# Patient Record
Sex: Female | Born: 1970 | Race: White | Hispanic: No | Marital: Married | Smoking: Never smoker
Health system: Southern US, Community
[De-identification: ages and names within clinical notes are randomized; demographics above are authoritative.]

## PROBLEM LIST (undated history)

## (undated) DIAGNOSIS — Z8742 Personal history of other diseases of the female genital tract: Secondary | ICD-10-CM

## (undated) HISTORY — PX: WISDOM TOOTH EXTRACTION: SHX21

## (undated) HISTORY — PX: INDUCED ABORTION: SHX677

## (undated) HISTORY — DX: Personal history of other diseases of the female genital tract: Z87.42

---

## 2007-04-11 ENCOUNTER — Encounter: Payer: Self-pay | Admitting: Obstetrics and Gynecology

## 2007-06-20 ENCOUNTER — Inpatient Hospital Stay: Payer: Self-pay

## 2007-08-01 HISTORY — PX: COLPOSCOPY: SHX161

## 2008-02-13 ENCOUNTER — Ambulatory Visit: Payer: Self-pay | Admitting: Unknown Physician Specialty

## 2008-03-10 ENCOUNTER — Ambulatory Visit: Payer: Self-pay | Admitting: Gynecologic Oncology

## 2009-10-25 ENCOUNTER — Ambulatory Visit: Payer: Self-pay | Admitting: Unknown Physician Specialty

## 2009-11-02 ENCOUNTER — Ambulatory Visit: Payer: Self-pay | Admitting: Unknown Physician Specialty

## 2012-07-31 HISTORY — PX: HYSTEROSCOPY WITH D & C: SHX1775

## 2013-03-11 ENCOUNTER — Ambulatory Visit: Payer: Self-pay | Admitting: Obstetrics and Gynecology

## 2013-03-11 LAB — CBC
HCT: 37.2 % (ref 35.0–47.0)
HGB: 13.3 g/dL (ref 12.0–16.0)
MCH: 30.6 pg (ref 26.0–34.0)
MCHC: 35.6 g/dL (ref 32.0–36.0)
MCV: 86 fL (ref 80–100)
Platelet: 207 10*3/uL (ref 150–440)
RDW: 12.5 % (ref 11.5–14.5)

## 2013-03-11 LAB — BASIC METABOLIC PANEL
BUN: 14 mg/dL (ref 7–18)
Chloride: 103 mmol/L (ref 98–107)
Co2: 31 mmol/L (ref 21–32)
Osmolality: 271 (ref 275–301)
Sodium: 136 mmol/L (ref 136–145)

## 2013-03-13 ENCOUNTER — Ambulatory Visit: Payer: Self-pay | Admitting: Obstetrics and Gynecology

## 2014-11-20 NOTE — Op Note (Signed)
PATIENT NAME:  Cassandra Macias, Cassandra Macias MR#:  [id number] DATE OF BIRTH:  08-30-1970  DATE OF PROCEDURE:  03/13/2013  PREOPERATIVE DIAGNOSIS: Menorrhagia and dysmenorrhea.  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  PROCEDURE PERFORMED: 1.  Hysteroscopy.  2.  Dilatation and curettage.   SURGEON: Malachy Mood, MD  ANESTHESIA USED: General.   ESTIMATED BLOOD LOSS: Minimal.   OPERATIVE FLUIDS: 1 liter.   COMPLICATIONS: None.   INTRAOPERATIVE FINDINGS: A small fibrous polyp in the right cornu. 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 NovaSure was aborted secondary to inability to seat the device correctly and unable to get a good seal secondary to the cervical dilation that had been required to pass the hysteroscope. The post-attempted NovaSure hysteroscopy was conducted to ensure that there was no perforations in the actual uterine body with none noted.   SPECIMENS REMOVED: Endometrial curettings.   PATIENT CONDITION FOLLOWING PROCEDURE: Stable.   PROCEDURE IN DETAIL: Risks, benefits and alternatives of the procedure were discussed with the patient prior to proceeding to the operating room. The patient had undergone a prior SIS showing cavity filling defects which is what prompted the procedure in the first place. The patient was taken to the operating room and placed under general endotracheal anesthesia. She was prepped and draped in the usual sterile fashion and positioned in the dorsal lithotomy position. Timeout was performed. Attention was turned to the patient's pelvis. An operative speculum was placed. The anterior lip of the cervix was visualized and grasped with a single-tooth tenaculum. There was a moderate amount of cervical stenosis. The patient had undergone a  prior LEEP. The cervix was sequentially dilated to allow passage of the hysteroscope. Hysteroscopic findings at the beginning of the case were notable for a right cornual fibroid versus polyp. The remainder of the cavity appeared grossly normal. The polyp or fibroid was removed using a combination of sharp curettage and endometrial polyp forceps. Following removal of the polyp, the uterus was sounded to 9 cm. Cervical length was 4 cm. The NovaSure device was placed with cavity width of 4.5 cm. However, the NovaSure device did not pass the cavity assessment.  The cervix had been dilated too much to allow good creation of a good seal. 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 Given the finding of the bicornuate uterus in addition to inability to create an adequate field, NovaSure ablation was aborted. The cervix was visualized to be hemostatic. Sponge, needle and instrument counts were correct x 2. The patient tolerated the procedure well and was taken to the recovery room in stable condition. ____________________________ Stoney Bang. Georgianne Fick, MD ams:sb D: 03/14/2013 09:57:05 ET T: 03/14/2013 10:15:50 ET JOB#: [id number]  cc: Stoney Bang. Georgianne Fick, MD, <Dictator> Dorthula Nettles MD ELECTRONICALLY SIGNED 04/01/2013 22:42

## 2016-03-09 LAB — HM PAP SMEAR: HM Pap smear: NEGATIVE

## 2017-03-06 ENCOUNTER — Encounter: Payer: Self-pay | Admitting: Obstetrics and Gynecology

## 2017-03-12 NOTE — Progress Notes (Signed)
Appointment cancelled ROS

## 2017-03-13 ENCOUNTER — Ambulatory Visit (INDEPENDENT_AMBULATORY_CARE_PROVIDER_SITE_OTHER): Payer: BC Managed Care – PPO | Admitting: Obstetrics and Gynecology

## 2017-03-13 DIAGNOSIS — Z5329 Procedure and treatment not carried out because of patient's decision for other reasons: Secondary | ICD-10-CM

## 2017-03-14 ENCOUNTER — Encounter: Payer: Self-pay | Admitting: Advanced Practice Midwife

## 2017-03-14 ENCOUNTER — Ambulatory Visit (INDEPENDENT_AMBULATORY_CARE_PROVIDER_SITE_OTHER): Payer: BC Managed Care – PPO | Admitting: Advanced Practice Midwife

## 2017-03-14 VITALS — BP 118/68 | HR 58 | Ht 67.0 in | Wt 161.0 lb

## 2017-03-14 DIAGNOSIS — Z3041 Encounter for surveillance of contraceptive pills: Secondary | ICD-10-CM | POA: Diagnosis not present

## 2017-03-14 DIAGNOSIS — Z01419 Encounter for gynecological examination (general) (routine) without abnormal findings: Secondary | ICD-10-CM | POA: Diagnosis not present

## 2017-03-14 DIAGNOSIS — Z124 Encounter for screening for malignant neoplasm of cervix: Secondary | ICD-10-CM

## 2017-03-14 MED ORDER — LO LOESTRIN FE 1 MG-10 MCG / 10 MCG PO TABS: 1.0000 | ORAL_TABLET | Freq: Every day | ORAL | 11 refills | Status: DC

## 2017-03-14 NOTE — Progress Notes (Signed)
Patient ID: Cassandra Macias, female   DOB: 06-25-1971, 46 y.o.   MRN: [id number]     Gynecology Annual Exam  PCP: Patient, No Pcp Per  Chief Complaint:  Chief Complaint  Patient presents with  . Gynecologic Exam    History of Present Illness: Patient is a 46 y.o. [id number] presents for annual exam. The patient has complaint today of recent irregular cycle. She had a heavy period at the end of June. Her periods are usually medium in flow.    LMP: Patient's last menstrual period was 02/26/2017 (exact date). Average Interval: regular, 28 days Duration of flow: 4 days Heavy Menses: no Clots: no Intermenstrual Bleeding: no Postcoital Bleeding: no Dysmenorrhea: no   The patient is sexually active. She currently uses OCP (estrogen/progesterone) for contraception. She denies dyspareunia.  The patient does not perform self breast exams.  There is no notable family history of breast or ovarian cancer in her family. Her mother was diagnosed with breast cancer in her 77's.  The patient wears seatbelts: yes.   The patient has regular exercise: yes.  She admits healthy lifestyle diet and adequate hydration.  The patient denies current symptoms of depression.    Review of Systems: Review of Systems  Constitutional: Negative.   HENT: Negative.   Eyes: Negative.   Respiratory: Negative.   Cardiovascular: Negative.   Gastrointestinal: Negative.   Genitourinary: Negative.   Musculoskeletal: Negative.   Skin: Negative.   Neurological: Negative.   Endo/Heme/Allergies: Negative.   Psychiatric/Behavioral: Negative.     Past Medical History:  Past Medical History:  Diagnosis Date  . History of abnormal cervical Pap smear     Past Surgical History:  Past Surgical History:  Procedure Laterality Date  . CESAREAN SECTION  2004, 2008   [facility]  . COLPOSCOPY  2009  . HYSTEROSCOPY W/D&C  2014   [facility])    Gynecologic History:  Patient's last menstrual period was  02/26/2017 (exact date). Contraception: OCP (estrogen/progesterone) Last Pap: 1 year ago Results were:  no abnormalities  Last mammogram: has not had a mammogram  Obstetric History: [id number]  Family History:  Family History  Problem Relation Age of Onset  . Breast cancer Mother 36  . COPD Father   . Diabetes Father        type 2  . Diabetes type I Sister        1/2 sister  . Diabetes Brother        type 2    Social History:  Social History   Social History  . Marital status: Married    Spouse name: N/A  . Number of children: N/A  . Years of education: N/A   Occupational History  . Not on file.   Social History Main Topics  . Smoking status: Never Smoker  . Smokeless tobacco: Never Used  . Alcohol use Yes  . Drug use: No  . Sexual activity: Yes    Birth control/ protection: Pill   Other Topics Concern  . Not on file   Social History Narrative  . No narrative on file    Allergies:  No Known Allergies  Medications: Prior to Admission medications   Medication Sig Start Date End Date Taking? Authorizing Provider  LO LOESTRIN FE 1 MG-10 MCG / 10 MCG tablet Take 1 tablet by mouth daily. 03/14/17  Yes Rod Can, CNM    Physical Exam Vitals: Blood pressure 118/68, pulse (!) 58, height 5\' 7"  (1.702 m), weight 161 lb (73  kg), last menstrual period 02/26/2017.  General: NAD HEENT: normocephalic, anicteric Thyroid: no enlargement, no palpable nodules Pulmonary: No increased work of breathing, CTAB Cardiovascular: RRR, distal pulses 2+ Breast: Breast symmetrical, no tenderness, no palpable nodules or masses, no skin or nipple retraction present, no nipple discharge.  No axillary or supraclavicular lymphadenopathy. Abdomen: NABS, soft, non-tender, non-distended.  Umbilicus without lesions.  No hepatomegaly, splenomegaly or masses palpable. No evidence of hernia  Genitourinary:  External: Normal external female genitalia.  Normal urethral meatus, normal    Bartholin's and Skene's glands.    Vagina: Normal vaginal mucosa, no evidence of prolapse.    Cervix: Grossly normal in appearance, no bleeding, no CMT  Uterus: Non-enlarged, mobile, normal contour.    Adnexa: ovaries non-enlarged, no adnexal masses  Rectal: deferred  Lymphatic: no evidence of inguinal lymphadenopathy Extremities: no edema, erythema, or tenderness Neurologic: Grossly intact Psychiatric: mood appropriate, affect full   Assessment: 46 y.o. [id number] routine annual exam  Plan: Problem List Items Addressed This Visit    None    Visit Diagnoses    Well woman exam with routine gynecological exam    -  Primary   Relevant Orders   IGP, Aptima HPV   Encounter for surveillance of contraceptive pills       Relevant Medications   LO LOESTRIN FE 1 MG-10 MCG / 10 MCG tablet   Cervical cancer screening       Relevant Orders   IGP, Aptima HPV      1) Mammogram - recommend yearly screening mammogram.  Mammogram recommended self scheduling for screening mammogram   2) STI screening was offered and declined  3) ASCCP guidelines and rational discussed.  Patient opts for yearly screening interval  4) Contraception - patient plans to continue with current OCP  5) Colonoscopy -- Screening recommended starting at age 37 for average risk individuals, age 64 for individuals deemed at increased risk (including African Americans) and recommended to continue until age 50.  For patient age 5-85 individualized approach is recommended.  Gold standard screening is via colonoscopy, Cologuard screening is an acceptable alternative for patient unwilling or unable to undergo colonoscopy.  "Colorectal cancer screening for average?risk adults: 2018 guideline update from the [place]: A Cancer Journal for Clinicians: Dec 27, 2016   6) Routine healthcare maintenance including cholesterol, diabetes screening discussed Declines   7) Continue healthy lifestyle diet and  exercise  8) Return to clinic in 1 year for annual exam   Rod Can, CNM

## 2017-03-16 LAB — IGP, APTIMA HPV
HPV APTIMA: NEGATIVE
PAP Smear Comment: 0

## 2017-11-13 ENCOUNTER — Other Ambulatory Visit: Payer: Self-pay | Admitting: Advanced Practice Midwife

## 2017-11-13 DIAGNOSIS — Z1231 Encounter for screening mammogram for malignant neoplasm of breast: Secondary | ICD-10-CM

## 2017-11-14 ENCOUNTER — Ambulatory Visit
Admission: RE | Admit: 2017-11-14 | Discharge: 2017-11-14 | Disposition: A | Discharge status: Home / Self Care | Payer: BC Managed Care – PPO | Source: Ambulatory Visit | Attending: Advanced Practice Midwife | Admitting: Advanced Practice Midwife

## 2017-11-14 DIAGNOSIS — Z1231 Encounter for screening mammogram for malignant neoplasm of breast: Secondary | ICD-10-CM

## 2017-11-22 ENCOUNTER — Other Ambulatory Visit: Payer: Self-pay | Admitting: *Deleted

## 2017-11-22 ENCOUNTER — Inpatient Hospital Stay
Admission: RE | Admit: 2017-11-22 | Discharge: 2017-11-22 | Disposition: A | Discharge status: Home / Self Care | Payer: Self-pay | Source: Ambulatory Visit | Attending: *Deleted | Admitting: *Deleted

## 2017-11-22 DIAGNOSIS — Z9289 Personal history of other medical treatment: Secondary | ICD-10-CM

## 2018-03-03 ENCOUNTER — Other Ambulatory Visit: Payer: Self-pay

## 2018-03-03 ENCOUNTER — Encounter: Payer: Self-pay | Admitting: Gynecology

## 2018-03-03 ENCOUNTER — Ambulatory Visit
Admission: EM | Admit: 2018-03-03 | Discharge: 2018-03-03 | Disposition: A | Discharge status: Home / Self Care | Payer: BC Managed Care – PPO | Attending: Family Medicine | Admitting: Family Medicine

## 2018-03-03 DIAGNOSIS — R21 Rash and other nonspecific skin eruption: Secondary | ICD-10-CM

## 2018-03-03 DIAGNOSIS — L255 Unspecified contact dermatitis due to plants, except food: Secondary | ICD-10-CM | POA: Diagnosis not present

## 2018-03-03 MED ORDER — HYDROXYZINE HCL 25 MG PO TABS: 25.0000 mg | ORAL_TABLET | Freq: Three times a day (TID) | ORAL | 0 refills | Status: DC | PRN

## 2018-03-03 MED ORDER — PREDNISONE 10 MG PO TABS: ORAL_TABLET | ORAL | 0 refills | Status: DC

## 2018-03-03 NOTE — ED Triage Notes (Signed)
Patient present with poison oak x 1 week.

## 2018-03-03 NOTE — ED Provider Notes (Signed)
[facility]    CSN: [id number] Arrival date & time: 03/03/18  0809  History   Chief Complaint Chief Complaint  Patient presents with  . Rash   HPI  47 year old female presents with rash.  Patient reports that she was doing yard work on Saturday (last week).  She states that she feels like she was exposed to poison ivy.  She developed rash on Wednesday or Thursday.  Rash has progressed.  It is itchy, vesicular.  She has used over-the-counter medications without resolution.  No known exacerbating factors.  Rash is severe.  No other associated symptoms.  No other complaints.  Past Medical History:  Diagnosis Date  . History of abnormal cervical Pap smear    Past Surgical History:  Procedure Laterality Date  . CESAREAN SECTION  2004, 2008   [facility]  . COLPOSCOPY  2009  . HYSTEROSCOPY W/D&C  2014   [facility] ([facility])    OB History    Gravida  3   Para  2   Term  2   Preterm      AB  1   Living  2     SAB  0   TAB  1   Ectopic      Multiple      Live Births  2          Home Medications    Prior to Admission medications   Medication Sig Start Date End Date Taking? Authorizing Provider  LO LOESTRIN FE 1 MG-10 MCG / 10 MCG tablet Take 1 tablet by mouth daily. 03/14/17  Yes Rod Can, CNM  hydrOXYzine (ATARAX/VISTARIL) 25 MG tablet Take 1 tablet (25 mg total) by mouth every 8 (eight) hours as needed. 03/03/18   Coral Spikes, DO  predniSONE (DELTASONE) 10 MG tablet 50 mg daily x 3 days, then 40 mg daily x 3 days, then 30 mg daily x 3 days, then 20 mg daily x 3 days, then 10 mg daily x 3 days. 03/03/18   Coral Spikes, DO    Family History Family History  Problem Relation Age of Onset  . Breast cancer Mother 10  . COPD Father   . Diabetes Father        type 2  . Diabetes type I Sister        1/2 sister  . Diabetes Brother        type 2    Social History Social History   Tobacco Use  . Smoking status: Never Smoker  . Smokeless  tobacco: Never Used  Substance Use Topics  . Alcohol use: Yes  . Drug use: No     Allergies   Patient has no known allergies.   Review of Systems Review of Systems  Constitutional: Negative.   Skin: Positive for rash.   Physical Exam Triage Vital Signs ED Triage Vitals  Enc Vitals Group     BP 03/03/18 0829 129/70     Pulse Rate 03/03/18 0829 60     Resp 03/03/18 0829 16     Temp 03/03/18 0829 98.5 F (36.9 C)     Temp Source 03/03/18 0829 Oral     SpO2 03/03/18 0829 100 %     Weight 03/03/18 0831 154 lb (69.9 kg)     Height 03/03/18 0831 5\' 7"  (1.702 m)     Head Circumference --      Peak Flow --      Pain Score 03/03/18 0840 0  Pain Loc --      Pain Edu? --      Excl. in [place]? --    Updated Vital Signs BP 129/70 (BP Location: Left Arm)   Pulse 60   Temp 98.5 F (36.9 C) (Oral)   Resp 16   Ht 5\' 7"  (1.702 m)   Wt 154 lb (69.9 kg)   LMP 02/28/2018   SpO2 100%   BMI 24.12 kg/m   Visual Acuity Right Eye Distance:   Left Eye Distance:   Bilateral Distance:    Right Eye Near:   Left Eye Near:    Bilateral Near:     Physical Exam  Constitutional: She is oriented to person, place, and time. She appears well-developed. No distress.  HENT:  Head: Normocephalic and atraumatic.  Pulmonary/Chest: Effort normal. No respiratory distress.  Neurological: She is alert and oriented to person, place, and time.  Skin:  Patient with scattered/diffuse areas of linear vesicular rash.  Some bullae present as well.  Psychiatric: She has a normal mood and affect. Her behavior is normal.  Nursing note and vitals reviewed.    UC Treatments / Results  Labs (all labs ordered are listed, but only abnormal results are displayed) Labs Reviewed - No data to display  EKG None  Radiology No results found.  Procedures Procedures (including critical care time)  Medications Ordered in UC Medications - No data to display  Initial Impression / Assessment and Plan / UC  Course  I have reviewed the triage vital signs and the nursing notes.  Pertinent labs & imaging results that were available during my care of the patient were reviewed by me and considered in my medical decision making (see chart for details).    47 year old female presents with dermatitis secondary to poison oak/ivy/sumac. Treating with prednisone and atarax.  Final Clinical Impressions(s) / UC Diagnoses   Final diagnoses:  Dermatitis due to plants, including poison ivy, sumac, and oak   Discharge Instructions   None    ED Prescriptions    Medication Sig Dispense Auth. Provider   predniSONE (DELTASONE) 10 MG tablet 50 mg daily x 3 days, then 40 mg daily x 3 days, then 30 mg daily x 3 days, then 20 mg daily x 3 days, then 10 mg daily x 3 days. 45 tablet Sameul Tagle G, DO   hydrOXYzine (ATARAX/VISTARIL) 25 MG tablet Take 1 tablet (25 mg total) by mouth every 8 (eight) hours as needed. 30 tablet Coral Spikes, DO     Controlled Substance Prescriptions [place] Controlled Substance Registry consulted? Not Applicable   Coral Spikes, DO 03/03/18 [phone]

## 2018-03-28 ENCOUNTER — Other Ambulatory Visit: Payer: Self-pay | Admitting: Advanced Practice Midwife

## 2018-03-28 DIAGNOSIS — Z3041 Encounter for surveillance of contraceptive pills: Secondary | ICD-10-CM

## 2018-04-25 NOTE — Telephone Encounter (Signed)
Pt has scheduled her AE for 05/23/18. Requesting refill. [phone]

## 2018-04-26 ENCOUNTER — Other Ambulatory Visit: Payer: Self-pay | Admitting: Advanced Practice Midwife

## 2018-04-26 DIAGNOSIS — Z3041 Encounter for surveillance of contraceptive pills: Secondary | ICD-10-CM

## 2018-04-26 MED ORDER — LO LOESTRIN FE 1 MG-10 MCG / 10 MCG PO TABS: 1.0000 | ORAL_TABLET | Freq: Every day | ORAL | 0 refills | Status: DC

## 2018-04-26 NOTE — Telephone Encounter (Signed)
1 month refill sent.

## 2018-04-26 NOTE — Progress Notes (Signed)
1 month refill sent. Annual is scheduled for 10/24.

## 2018-05-19 ENCOUNTER — Other Ambulatory Visit: Payer: Self-pay | Admitting: Advanced Practice Midwife

## 2018-05-19 DIAGNOSIS — Z3041 Encounter for surveillance of contraceptive pills: Secondary | ICD-10-CM

## 2018-05-20 NOTE — Telephone Encounter (Signed)
Please advise 

## 2018-05-23 ENCOUNTER — Ambulatory Visit (INDEPENDENT_AMBULATORY_CARE_PROVIDER_SITE_OTHER): Payer: BC Managed Care – PPO | Admitting: Advanced Practice Midwife

## 2018-05-23 ENCOUNTER — Encounter: Payer: Self-pay | Admitting: Advanced Practice Midwife

## 2018-05-23 VITALS — BP 94/60 | HR 66 | Ht 67.0 in | Wt 153.0 lb

## 2018-05-23 DIAGNOSIS — Z01419 Encounter for gynecological examination (general) (routine) without abnormal findings: Secondary | ICD-10-CM

## 2018-05-23 DIAGNOSIS — Z0000 Encounter for general adult medical examination without abnormal findings: Secondary | ICD-10-CM

## 2018-05-23 DIAGNOSIS — Z1239 Encounter for other screening for malignant neoplasm of breast: Secondary | ICD-10-CM

## 2018-05-23 NOTE — Patient Instructions (Signed)
Preventive Care 18-39 Years, Female Preventive care refers to lifestyle choices and visits with your health care provider that can promote health and wellness. What does preventive care include?  A yearly physical exam. This is also called an annual well check.  Dental exams once or twice a year.  Routine eye exams. Ask your health care provider how often you should have your eyes checked.  Personal lifestyle choices, including: ? Daily care of your teeth and gums. ? Regular physical activity. ? Eating a healthy diet. ? Avoiding tobacco and drug use. ? Limiting alcohol use. ? Practicing safe sex. ? Taking vitamin and mineral supplements as recommended by your health care provider. What happens during an annual well check? The services and screenings done by your health care provider during your annual well check will depend on your age, overall health, lifestyle risk factors, and family history of disease. Counseling Your health care provider may ask you questions about your:  Alcohol use.  Tobacco use.  Drug use.  Emotional well-being.  Home and relationship well-being.  Sexual activity.  Eating habits.  Work and work Statistician.  Method of birth control.  Menstrual cycle.  Pregnancy history.  Screening You may have the following tests or measurements:  Height, weight, and BMI.  Diabetes screening. This is done by checking your blood sugar (glucose) after you have not eaten for a while (fasting).  Blood pressure.  Lipid and cholesterol levels. These may be checked every 5 years starting at age 38.  Skin check.  Hepatitis C blood test.  Hepatitis B blood test.  Sexually transmitted disease (STD) testing.  BRCA-related cancer screening. This may be done if you have a family history of breast, ovarian, tubal, or peritoneal cancers.  Pelvic exam and Pap test. This may be done every 3 years starting at age 38. Starting at age 30, this may be done  every 5 years if you have a Pap test in combination with an HPV test.  Discuss your test results, treatment options, and if necessary, the need for more tests with your health care provider. Vaccines Your health care provider may recommend certain vaccines, such as:  Influenza vaccine. This is recommended every year.  Tetanus, diphtheria, and acellular pertussis (Tdap, Td) vaccine. You may need a Td booster every 10 years.  Varicella vaccine. You may need this if you have not been vaccinated.  HPV vaccine. If you are 39 or younger, you may need three doses over 6 months.  Measles, mumps, and rubella (MMR) vaccine. You may need at least one dose of MMR. You may also need a second dose.  Pneumococcal 13-valent conjugate (PCV13) vaccine. You may need this if you have certain conditions and were not previously vaccinated.  Pneumococcal polysaccharide (PPSV23) vaccine. You may need one or two doses if you smoke cigarettes or if you have certain conditions.  Meningococcal vaccine. One dose is recommended if you are age 68-21 years and a first-year college student living in a residence hall, or if you have one of several medical conditions. You may also need additional booster doses.  Hepatitis A vaccine. You may need this if you have certain conditions or if you travel or work in places where you may be exposed to hepatitis A.  Hepatitis B vaccine. You may need this if you have certain conditions or if you travel or work in places where you may be exposed to hepatitis B.  Haemophilus influenzae type b (Hib) vaccine. You may need this  if you have certain risk factors.  Talk to your health care provider about which screenings and vaccines you need and how often you need them. This information is not intended to replace advice given to you by your health care provider. Make sure you discuss any questions you have with your health care provider. Document Released: 09/12/2001 Document Revised:  04/05/2016 Document Reviewed: 05/18/2015 Elsevier Interactive Patient Education  [employer].

## 2018-05-23 NOTE — Progress Notes (Signed)
Patient ID: Cassandra Macias, female   DOB: 1971-01-30, 47 y.o.   MRN: [id number]    Gynecology Annual Exam  PCP: Patient, No Pcp Per  Chief Complaint:  Chief Complaint  Patient presents with  . Gynecologic Exam    History of Present Illness: Patient is a 47 y.o. [id number] presents for annual exam. The patient has no complaints today.   LMP: Patient's last menstrual period was 04/27/2018. Average Interval: regular, 28 days Duration of flow: 4 days Heavy Menses: no Clots: no Intermenstrual Bleeding: no Postcoital Bleeding: no Dysmenorrhea: no   The patient is sexually active. She currently uses OCP (estrogen/progesterone) for contraception. She denies dyspareunia.  The patient does perform self breast exams.  There is no notable family history of breast or ovarian cancer in her family. Her mother was diagnosed with breast cancer in her 40s.  The patient wears seatbelts: yes.   The patient has regular exercise: She is a runner and also does some strength work outs. She admits healthy lifestyle diet, hydration and sleep.    The patient denies current symptoms of depression.    Review of Systems: Review of Systems  Constitutional: Negative.   HENT: Negative.   Eyes: Negative.   Respiratory: Negative.   Cardiovascular: Negative.   Gastrointestinal: Negative.   Genitourinary: Negative.   Musculoskeletal: Negative.   Skin: Negative.   Neurological: Negative.   Endo/Heme/Allergies: Negative.   Psychiatric/Behavioral: Negative.     Past Medical History:  Past Medical History:  Diagnosis Date  . History of abnormal cervical Pap smear     Past Surgical History:  Past Surgical History:  Procedure Laterality Date  . CESAREAN SECTION  2004, 2008   [facility]  . COLPOSCOPY  2009  . HYSTEROSCOPY W/D&C  2014   [facility])  . INDUCED ABORTION      Gynecologic History:  Patient's last menstrual period was 04/27/2018. Contraception: OCP (estrogen/progesterone) Last  Pap: 1 year ago Results were:  no abnormalities  Last mammogram: 6 months ago Results were: BI-RAD I  Obstetric History: [id number]  Family History:  Family History  Problem Relation Age of Onset  . Breast cancer Mother 38  . COPD Father   . Diabetes Father        type 2  . Hypertension Father   . Diabetes type I Sister        1/2 sister  . Diabetes Brother        type 2    Social History:  Social History   Socioeconomic History  . Marital status: Married    Spouse name: Not on file  . Number of children: 2  . Years of education: 58  . Highest education level: Not on file  Occupational History  . Occupation: TEACHER    Comment: [employer]  Social Needs  . Financial resource strain: Not on file  . Food insecurity:    Worry: Not on file    Inability: Not on file  . Transportation needs:    Medical: Not on file    Non-medical: Not on file  Tobacco Use  . Smoking status: Never Smoker  . Smokeless tobacco: Never Used  Substance and Sexual Activity  . Alcohol use: Yes  . Drug use: No  . Sexual activity: Yes    Birth control/protection: Pill  Lifestyle  . Physical activity:    Days per week: Not on file    Minutes per session: Not on file  . Stress: Not on file  Relationships  . Social connections:    Talks on phone: Not on file    Gets together: Not on file    Attends religious service: Not on file    Active member of club or organization: Not on file    Attends meetings of clubs or organizations: Not on file    Relationship status: Not on file  . Intimate partner violence:    Fear of current or ex partner: Not on file    Emotionally abused: Not on file    Physically abused: Not on file    Forced sexual activity: Not on file  Other Topics Concern  . Not on file  Social History Narrative  . Not on file    Allergies:  No Known Allergies  Medications: Prior to Admission medications   Medication Sig Start Date End Date Taking? Authorizing  Provider  LO LOESTRIN FE 1 MG-10 MCG / 10 MCG tablet Take 1 tablet by mouth daily. 04/26/18  Yes Rod Can, CNM    Physical Exam Vitals: Blood pressure 94/60, pulse 66, height 5\' 7"  (1.702 m), weight 153 lb (69.4 kg), last menstrual period 04/27/2018.  General: NAD HEENT: normocephalic, anicteric Thyroid: no enlargement, no palpable nodules Pulmonary: No increased work of breathing, CTAB Cardiovascular: RRR, distal pulses 2+ Breast: Breast symmetrical, no tenderness, no palpable nodules or masses, no skin or nipple retraction present, no nipple discharge.  No axillary or supraclavicular lymphadenopathy. Abdomen: NABS, soft, non-tender, non-distended.  Umbilicus without lesions.  No hepatomegaly, splenomegaly or masses palpable. No evidence of hernia  Genitourinary: deferred for no concerns/PAP interval/shared decision making Extremities: no edema, erythema, or tenderness Neurologic: Grossly intact Psychiatric: mood appropriate, affect full   Assessment: 47 y.o. [id number] routine annual exam  Plan: Problem List Items Addressed This Visit    None    Visit Diagnoses    Well woman exam without gynecological exam    -  Primary   Breast cancer screening       Relevant Orders   MM DIGITAL SCREENING BILATERAL      1) Mammogram - recommend yearly screening mammogram.  Mammogram ordered for 6 months from now for annual screening  2) STI screening  wasoffered and declined  3) ASCCP guidelines and rational discussed.  Patient opts for every 3 years screening interval  4) Contraception - the patient is currently using  OCP (estrogen/progesterone).  She is happy with her current form of contraception and plans to continue  5) Colonoscopy -- Screening recommended starting at age 11 for average risk individuals, age 33 for individuals deemed at increased risk (including African Americans) and recommended to continue until age 36.  For patient age 34-85 individualized approach is  recommended.  Gold standard screening is via colonoscopy, Cologuard screening is an acceptable alternative for patient unwilling or unable to undergo colonoscopy.  "Colorectal cancer screening for average?risk adults: 2018 guideline update from the American Cancer Society"[place]: A Cancer Journal for Clinicians: Dec 27, 2016   6) Routine healthcare maintenance including cholesterol, diabetes screening discussed managed by PCP  7) Return in 1 year (on 05/24/2019) for annual established gyn.   Rod Can, CNM [facility], [facility] 05/23/2018, 8:47 AM

## 2018-06-08 ENCOUNTER — Other Ambulatory Visit: Payer: Self-pay | Admitting: Advanced Practice Midwife

## 2018-06-08 DIAGNOSIS — Z3041 Encounter for surveillance of contraceptive pills: Secondary | ICD-10-CM

## 2019-05-20 ENCOUNTER — Other Ambulatory Visit: Payer: Self-pay | Admitting: Advanced Practice Midwife

## 2019-05-20 DIAGNOSIS — Z3041 Encounter for surveillance of contraceptive pills: Secondary | ICD-10-CM

## 2019-05-29 ENCOUNTER — Other Ambulatory Visit: Payer: Self-pay

## 2019-05-29 ENCOUNTER — Encounter: Payer: Self-pay | Admitting: Advanced Practice Midwife

## 2019-05-29 ENCOUNTER — Ambulatory Visit (INDEPENDENT_AMBULATORY_CARE_PROVIDER_SITE_OTHER): Payer: BC Managed Care – PPO | Admitting: Advanced Practice Midwife

## 2019-05-29 VITALS — BP 121/70 | HR 61 | Ht 67.0 in | Wt 162.0 lb

## 2019-05-29 DIAGNOSIS — Z23 Encounter for immunization: Secondary | ICD-10-CM | POA: Diagnosis not present

## 2019-05-29 DIAGNOSIS — Z01419 Encounter for gynecological examination (general) (routine) without abnormal findings: Secondary | ICD-10-CM | POA: Diagnosis not present

## 2019-05-29 DIAGNOSIS — Z3041 Encounter for surveillance of contraceptive pills: Secondary | ICD-10-CM

## 2019-05-29 DIAGNOSIS — Z1231 Encounter for screening mammogram for malignant neoplasm of breast: Secondary | ICD-10-CM

## 2019-05-29 DIAGNOSIS — Z0000 Encounter for general adult medical examination without abnormal findings: Secondary | ICD-10-CM

## 2019-05-29 MED ORDER — LO LOESTRIN FE 1 MG-10 MCG / 10 MCG PO TABS: 1.0000 | ORAL_TABLET | Freq: Every day | ORAL | 4 refills | Status: DC

## 2019-05-29 NOTE — Progress Notes (Signed)
Gynecology Annual Exam  PCP: Patient, No Pcp Per  Chief Complaint:  Chief Complaint  Patient presents with  . Gynecologic Exam    History of Present Illness: Patient is a 49 y.o. [id number] presents for annual exam. The patient has no complaints today.   LMP: Patient's last menstrual period was 05/21/2019 (exact date). Average Interval: regular, 28 days Duration of flow: 2 days Heavy Menses: no Clots: no Intermenstrual Bleeding: occasional breakthrough bleeding with heavy exercise Postcoital Bleeding: no Dysmenorrhea: no   The patient is sexually active. She currently uses OCP (estrogen/progesterone) for contraception. She denies dyspareunia.  The patient does perform self breast exams.  There is no notable family history of breast or ovarian cancer in her family. Her mother was diagnosed with breast cancer in her 58s.  The patient wears seatbelts: yes.   The patient has regular exercise: She runs regularly and does video workouts. She admits healthy diet, hydration and sleep.   The patient denies current symptoms of depression.    Review of Systems: Review of Systems  Constitutional: Negative.   HENT: Negative.   Eyes: Negative.   Respiratory: Negative.   Cardiovascular: Negative.   Gastrointestinal: Negative.   Genitourinary: Negative.   Musculoskeletal: Negative.   Skin: Negative.   Neurological: Negative.   Endo/Heme/Allergies: Negative.   Psychiatric/Behavioral: Negative.     Past Medical History:  Past Medical History:  Diagnosis Date  . History of abnormal cervical Pap smear     Past Surgical History:  Past Surgical History:  Procedure Laterality Date  . CESAREAN SECTION  2004, 2008   [facility]  . COLPOSCOPY  2009  . HYSTEROSCOPY W/D&C  2014   [facility])  . INDUCED ABORTION      Gynecologic History:  Patient's last menstrual period was 05/21/2019 (exact date). Contraception: OCP (estrogen/progesterone) Last Pap: 2 years ago Results were:   no abnormalities  Last mammogram: 1 year ago Results were: BI-RAD I  Obstetric [id number]  Family History:  Family History  Problem Relation Age of Onset  . Breast cancer Mother 70  . COPD Father   . Diabetes Father        type 2  . Hypertension Father   . Diabetes type I Sister        1/2 sister  . Diabetes Brother        type 2    Social History:  Social History   Socioeconomic History  . Marital status: Married    Spouse name: Not on file  . Number of children: 2  . Years of education: 33  . Highest education level: Not on file  Occupational History  . Occupation: TEACHER    Comment: [employer]  Social Needs  . Financial resource strain: Not on file  . Food insecurity    Worry: Not on file    Inability: Not on file  . Transportation needs    Medical: Not on file    Non-medical: Not on file  Tobacco Use  . Smoking status: Never Smoker  . Smokeless tobacco: Never Used  Substance and Sexual Activity  . Alcohol use: Yes  . Drug use: No  . Sexual activity: Yes    Birth control/protection: Pill  Lifestyle  . Physical activity    Days per week: Not on file    Minutes per session: Not on file  . Stress: Not on file  Relationships  . Social connections    Talks on phone: Not on file  Gets together: Not on file    Attends religious service: Not on file    Active member of club or organization: Not on file    Attends meetings of clubs or organizations: Not on file    Relationship status: Not on file  . Intimate partner violence    Fear of current or ex partner: Not on file    Emotionally abused: Not on file    Physically abused: Not on file    Forced sexual activity: Not on file  Other Topics Concern  . Not on file  Social History Narrative  . Not on file    Allergies:  No Known Allergies  Medications: Prior to Admission medications   Medication Sig Start Date End Date Taking? Authorizing Provider  LO LOESTRIN FE 1 MG-10 MCG  / 10 MCG tablet Take 1 tablet by mouth daily. 05/29/19  Yes Rod Can, CNM    Physical Exam Vitals: Blood pressure 121/70, pulse 61, height 5\' 7"  (1.702 m), weight 162 lb (73.5 kg), last menstrual period 05/21/2019.  General: NAD HEENT: normocephalic, anicteric Thyroid: no enlargement, no palpable nodules Pulmonary: No increased work of breathing, CTAB Cardiovascular: RRR, distal pulses 2+ Breast: Breast symmetrical, no tenderness, no palpable nodules or masses, no skin or nipple retraction present, no nipple discharge.  No axillary or supraclavicular lymphadenopathy. Abdomen: NABS, soft, non-tender, non-distended.  Umbilicus without lesions.  No hepatomegaly, splenomegaly or masses palpable. No evidence of hernia  Genitourinary:  External: Normal external female genitalia.  Normal urethral meatus, normal Bartholin's and Skene's glands.    Vagina: Normal vaginal mucosa, no evidence of prolapse.    Cervix: Grossly normal in appearance, no bleeding  Uterus: Non-enlarged, mobile, normal contour.  No CMT  Adnexa: ovaries non-enlarged, no adnexal masses  Rectal: deferred  Lymphatic: no evidence of inguinal lymphadenopathy Extremities: no edema, erythema, or tenderness Neurologic: Grossly intact Psychiatric: mood appropriate, affect full  Female chaperone present for pelvic and breast  portions of the physical exam    Assessment: 48 y.o. [id number] routine annual exam  Plan: Problem List Items Addressed This Visit    None    Visit Diagnoses    Well woman exam without gynecological exam    -  Primary   Relevant Orders   MM DIGITAL SCREENING BILATERAL   Flu vaccine need       Relevant Orders   Flu Vaccine QUAD 36+ mos IM (Completed)   Encounter for surveillance of contraceptive pills       Relevant Medications   LO LOESTRIN FE 1 MG-10 MCG / 10 MCG tablet   Encounter for screening mammogram for malignant neoplasm of breast       Relevant Orders   MM DIGITAL SCREENING BILATERAL       1) Mammogram - recommend yearly screening mammogram.  Mammogram Was ordered today   2) STI screening  was offered and declined  3) ASCCP guidelines and rationale discussed.  Patient opts for every 3 years screening interval  4) Contraception - the patient is currently using  OCP (estrogen/progesterone).  She is happy with her current form of contraception and plans to continue  5) Colonoscopy -- Screening recommended starting at age 27 for average risk individuals, age 90 for individuals deemed at increased risk (including African Americans) and recommended to continue until age 84.  For patient age 50-85 individualized approach is recommended.  Gold standard screening is via colonoscopy, Cologuard screening is an acceptable alternative for patient unwilling or unable to undergo colonoscopy.  "  Colorectal cancer screening for average?risk adults: 2018 guideline update from the [place]: A Cancer Journal for Clinicians: Dec 27, 2016   6) Routine healthcare maintenance including cholesterol, diabetes screening discussed Declines  7) Return in about 1 year (around 05/28/2020) for annual established gyn.   Rod Can, [facility] 05/29/2019, 4:03 PM

## 2019-06-05 ENCOUNTER — Other Ambulatory Visit: Payer: Self-pay | Admitting: Advanced Practice Midwife

## 2019-06-05 DIAGNOSIS — Z1231 Encounter for screening mammogram for malignant neoplasm of breast: Secondary | ICD-10-CM

## 2019-06-10 ENCOUNTER — Other Ambulatory Visit: Payer: Self-pay

## 2019-06-10 DIAGNOSIS — Z20822 Contact with and (suspected) exposure to covid-19: Secondary | ICD-10-CM

## 2019-06-12 LAB — NOVEL CORONAVIRUS, NAA: SARS-CoV-2, NAA: DETECTED — AB

## 2019-09-15 ENCOUNTER — Other Ambulatory Visit: Payer: Self-pay

## 2019-09-15 ENCOUNTER — Ambulatory Visit
Admission: RE | Admit: 2019-09-15 | Discharge: 2019-09-15 | Disposition: A | Discharge status: Home / Self Care | Payer: BC Managed Care – PPO | Source: Ambulatory Visit | Attending: Advanced Practice Midwife | Admitting: Advanced Practice Midwife

## 2019-09-15 DIAGNOSIS — Z1231 Encounter for screening mammogram for malignant neoplasm of breast: Secondary | ICD-10-CM | POA: Insufficient documentation

## 2019-09-25 ENCOUNTER — Ambulatory Visit: Payer: BC Managed Care – PPO

## 2019-09-27 ENCOUNTER — Ambulatory Visit: Payer: BC Managed Care – PPO

## 2020-06-17 ENCOUNTER — Other Ambulatory Visit: Payer: Self-pay | Admitting: Advanced Practice Midwife

## 2020-06-17 DIAGNOSIS — Z3041 Encounter for surveillance of contraceptive pills: Secondary | ICD-10-CM

## 2020-09-15 ENCOUNTER — Other Ambulatory Visit (HOSPITAL_COMMUNITY)
Admission: RE | Admit: 2020-09-15 | Discharge: 2020-09-15 | Disposition: A | Discharge status: Home / Self Care | Payer: BC Managed Care – PPO | Source: Ambulatory Visit | Attending: Advanced Practice Midwife | Admitting: Advanced Practice Midwife

## 2020-09-15 ENCOUNTER — Encounter: Payer: Self-pay | Admitting: Advanced Practice Midwife

## 2020-09-15 ENCOUNTER — Ambulatory Visit (INDEPENDENT_AMBULATORY_CARE_PROVIDER_SITE_OTHER): Payer: BC Managed Care – PPO | Admitting: Advanced Practice Midwife

## 2020-09-15 ENCOUNTER — Other Ambulatory Visit: Payer: Self-pay

## 2020-09-15 VITALS — BP 128/68 | Ht 67.0 in | Wt 162.0 lb

## 2020-09-15 DIAGNOSIS — Z01419 Encounter for gynecological examination (general) (routine) without abnormal findings: Secondary | ICD-10-CM | POA: Insufficient documentation

## 2020-09-15 DIAGNOSIS — Z124 Encounter for screening for malignant neoplasm of cervix: Secondary | ICD-10-CM

## 2020-09-15 DIAGNOSIS — N926 Irregular menstruation, unspecified: Secondary | ICD-10-CM

## 2020-09-15 DIAGNOSIS — Z1239 Encounter for other screening for malignant neoplasm of breast: Secondary | ICD-10-CM

## 2020-09-15 DIAGNOSIS — Z3041 Encounter for surveillance of contraceptive pills: Secondary | ICD-10-CM

## 2020-09-15 MED ORDER — LEVONORGESTREL-ETHINYL ESTRAD 0.1-20 MG-MCG PO TABS: 1.0000 | ORAL_TABLET | Freq: Every day | ORAL | 3 refills | Status: DC

## 2020-09-15 NOTE — Patient Instructions (Signed)
https://www.womenshealth.gov/menopause/menopause-basics"> https://www.clinicalkey.com">  Menopause Menopause is the normal time of a woman's life when menstrual periods stop completely. It marks the natural end to a woman's ability to become pregnant. It can be defined as the absence of a menstrual period for 12 months without another medical cause. The transition to menopause (perimenopause) most often happens between the ages of 45 and 55, and can last for many years. During perimenopause, hormone levels change in your body, which can cause symptoms and affect your health. Menopause may increase your risk for:  Weakened bones (osteoporosis), which causes fractures.  Depression.  Hardening and narrowing of the arteries (atherosclerosis), which can cause heart attacks and strokes. What are the causes? This condition is usually caused by a natural change in hormone levels that happens as you get older. The condition may also be caused by changes that are not natural, including:  Surgery to remove both ovaries (surgical menopause).  Side effects from some medicines, such as chemotherapy used to treat cancer (chemical menopause). What increases the risk? This condition is more likely to start at an earlier age if you have certain medical conditions or have undergone treatments, including:  A tumor of the pituitary gland in the brain.  A disease that affects the ovaries and hormones.  Certain cancer treatments, such as chemotherapy or hormone therapy, or radiation therapy on the pelvis.  Heavy smoking and excessive alcohol use.  Family history of early menopause. This condition is also more likely to develop earlier in women who are very thin. What are the signs or symptoms? Symptoms of this condition include:  Hot flashes.  Irregular menstrual periods.  Night sweats.  Changes in feelings about sex. This could be a decrease in sex drive or an increased discomfort around your  sexuality.  Vaginal dryness and thinning of the vaginal walls. This may cause painful sex.  Dryness of the skin and development of wrinkles.  Headaches.  Problems sleeping (insomnia).  Mood swings or irritability.  Memory problems.  Weight gain.  Hair growth on the face and chest.  Bladder infections or problems with urinating. How is this diagnosed? This condition is diagnosed based on your medical history, a physical exam, your age, your menstrual history, and your symptoms. Hormone tests may also be done. How is this treated? In some cases, no treatment is needed. You and your health care provider should make a decision together about whether treatment is necessary. Treatment will be based on your individual condition and preferences. Treatment for this condition focuses on managing symptoms. Treatment may include:  Menopausal hormone therapy (MHT).  Medicines to treat specific symptoms or complications.  Acupuncture.  Vitamin or herbal supplements. Before starting treatment, make sure to let your health care provider know if you have a personal or family history of these conditions:  Heart disease.  Breast cancer.  Blood clots.  Diabetes.  Osteoporosis. Follow these instructions at home: Lifestyle  Do not use any products that contain nicotine or tobacco, such as cigarettes, e-cigarettes, and chewing tobacco. If you need help quitting, ask your health care provider.  Get at least 30 minutes of physical activity on 5 or more days each week.  Avoid alcoholic and caffeinated beverages, as well as spicy foods. This may help prevent hot flashes.  Get 7-8 hours of sleep each night.  If you have hot flashes, try: ? Dressing in layers. ? Avoiding things that may trigger hot flashes, such as spicy food, warm places, or stress. ? Taking slow, deep   breaths when a hot flash starts. ? Keeping a fan in your home and office.  Find ways to manage stress, such as deep  breathing, meditation, or journaling.  Consider going to group therapy with other women who are having menopause symptoms. Ask your health care provider about recommended group therapy meetings. Eating and drinking  Eat a healthy, balanced diet that contains whole grains, lean protein, low-fat dairy, and plenty of fruits and vegetables.  Your health care provider may recommend adding more soy to your diet. Foods that contain soy include tofu, tempeh, and soy milk.  Eat plenty of foods that contain calcium and vitamin D for bone health. Items that are rich in calcium include low-fat milk, yogurt, beans, almonds, sardines, broccoli, and kale.   Medicines  Take over-the-counter and prescription medicines only as told by your health care provider.  Talk with your health care provider before starting any herbal supplements. If prescribed, take vitamins and supplements as told by your health care provider. General instructions  Keep track of your menstrual periods, including: ? When they occur. ? How heavy they are and how long they last. ? How much time passes between periods.  Keep track of your symptoms, noting when they start, how often you have them, and how long they last.  Use vaginal lubricants or moisturizers to help with vaginal dryness and improve comfort during sex.  Keep all follow-up visits. This is important. This includes any group therapy or counseling.   Contact a health care provider if:  You are still having menstrual periods after age 55.  You have pain during sex.  You have not had a period for 12 months and you develop vaginal bleeding. Get help right away if you have:  Severe depression.  Excessive vaginal bleeding.  Pain when you urinate.  A fast or irregular heartbeat (palpitations).  Severe headaches.  Abdominal pain or severe indigestion. Summary  Menopause is a normal time of life when menstrual periods stop completely. It is usually defined as  the absence of a menstrual period for 12 months without another medical cause.  The transition to menopause (perimenopause) most often happens between the ages of 45 and 55 and can last for several years.  Symptoms can be managed through medicines, lifestyle changes, and complementary therapies such as acupuncture.  Eat a balanced diet that is rich in nutrients to promote bone health and heart health and to manage symptoms during menopause. This information is not intended to replace advice given to you by your health care provider. Make sure you discuss any questions you have with your health care provider. Document Revised: 04/16/2020 Document Reviewed: 01/01/2020 Elsevier Patient Education  2021 [employer]  

## 2020-09-15 NOTE — Progress Notes (Signed)
Gynecology Annual Exam  PCP: Patient, No Pcp Per  Chief Complaint:  Chief Complaint  Patient presents with  . Annual Exam  . Menstrual Problem    Abnormal periods since November, every other week    History of Present Illness: Patient is a 50 y.o. [id number] presents for annual exam. The patient has complaints today of frequent and irregular menstrual bleeding for the past several months. Through November of 2021 she was having regular monthly periods. Since December she is having heavy bleeding with clots weekly for 2-3 days at a time. She denies pain associated with the bleeding. We discussed normal changes as she approaches menopause and management options including; waiting to see if this pattern continues, changing dose of OCP, imaging.   LMP: No LMP recorded. (Menstrual status: Irregular Periods).  Postcoital Bleeding: no Dysmenorrhea: no   The patient is sexually active. She currently uses OCP (estrogen/progesterone) for contraception. She denies dyspareunia.  The patient does perform self breast exams.  There is no notable family history of breast or ovarian cancer in her family.  The patient wears seatbelts: yes.   The patient has regular exercise: she runs and walks, she admits healthy lifestyle; diet, hydration, sleep.    The patient denies current symptoms of depression.    Review of Systems: Review of Systems  Constitutional: Negative for chills and fever.  HENT: Negative for congestion, ear discharge, ear pain, hearing loss, sinus pain and sore throat.   Eyes: Negative for blurred vision and double vision.  Respiratory: Negative for cough, shortness of breath and wheezing.   Cardiovascular: Negative for chest pain, palpitations and leg swelling.  Gastrointestinal: Negative for abdominal pain, blood in stool, constipation, diarrhea, heartburn, melena, nausea and vomiting.  Genitourinary: Negative for dysuria, flank pain, frequency, hematuria and urgency.   Musculoskeletal: Negative for back pain, joint pain and myalgias.  Skin: Negative for itching and rash.  Neurological: Negative for dizziness, tingling, tremors, sensory change, speech change, focal weakness, seizures, loss of consciousness, weakness and headaches.  Endo/Heme/Allergies: Negative for environmental allergies. Does not bruise/bleed easily.       Positive for irregular menstrual bleeding and hot flashes  Psychiatric/Behavioral: Negative for depression, hallucinations, memory loss, substance abuse and suicidal ideas. The patient is not nervous/anxious and does not have insomnia.     Past Medical History:  There are no problems to display for this patient.   Past Surgical History:  Past Surgical History:  Procedure Laterality Date  . CESAREAN SECTION  2004, 2008   [facility]  . COLPOSCOPY  2009  . HYSTEROSCOPY WITH D & C  2014   [facility])  . INDUCED ABORTION      Gynecologic History:  No LMP recorded. (Menstrual status: Irregular Periods). Contraception: OCP (estrogen/progesterone) Last Pap: 2018 Results were:  no abnormalities  Last mammogram: 1 year ago Results were: BI-RAD I  Obstetric History: [id number]  Family History:  Family History  Problem Relation Age of Onset  . Breast cancer Mother 43  . COPD Father   . Diabetes Father        type 2  . Hypertension Father   . Diabetes type I Sister        1/2 sister  . Diabetes Brother        type 2    Social History:  Social History   Socioeconomic History  . Marital status: Married    Spouse name: Not on file  . Number of children: 2  . Years of  education: 72  . Highest education level: Not on file  Occupational History  . Occupation: TEACHER    Comment: [employer]  Tobacco Use  . Smoking status: Never Smoker  . Smokeless tobacco: Never Used  Vaping Use  . Vaping Use: Never used  Substance and Sexual Activity  . Alcohol use: Yes  . Drug use: No  . Sexual activity: Yes     Birth control/protection: Pill  Other Topics Concern  . Not on file  Social History Narrative  . Not on file   Social Determinants of Health   Financial Resource Strain: Not on file  Food Insecurity: Not on file  Transportation Needs: Not on file  Physical Activity: Not on file  Stress: Not on file  Social Connections: Not on file  Intimate Partner Violence: Not on file    Allergies:  No Known Allergies  Medications: Prior to Admission medications   Medication Sig Start Date End Date Taking? Authorizing Provider  levonorgestrel-ethinyl estradiol (AVIANE) 0.1-20 MG-MCG tablet Take 1 tablet by mouth daily. 09/15/20  Yes Rod Can, CNM    Physical Exam Vitals: Blood pressure 128/68, height 5\' 7"  (1.702 m), weight 162 lb (73.5 kg).  General: NAD HEENT: normocephalic, anicteric Thyroid: no enlargement, no palpable nodules Pulmonary: No increased work of breathing, CTAB Cardiovascular: RRR, distal pulses 2+ Breast: Breast symmetrical, no tenderness, no palpable nodules or masses, no skin or nipple retraction present, no nipple discharge.  No axillary or supraclavicular lymphadenopathy. Abdomen: NABS, soft, non-tender, non-distended.  Umbilicus without lesions.  No hepatomegaly, splenomegaly or masses palpable. No evidence of hernia  Genitourinary:  External: Normal external female genitalia.  Normal urethral meatus, normal Bartholin's and Skene's glands.    Vagina: Normal vaginal mucosa, no evidence of prolapse. Brown blood in vault    Cervix: difficult to visualize due to tilt, no bleeding, no CMT  Uterus: Non-enlarged, mobile, normal contour.    Adnexa: ovaries non-enlarged, no adnexal masses  Rectal: deferred  Lymphatic: no evidence of inguinal lymphadenopathy Extremities: no edema, erythema, or tenderness Neurologic: Grossly intact Psychiatric: mood appropriate, affect full    Assessment: 50 y.o. [id number] routine annual exam  Plan: Problem List Items Addressed  This Visit   None   Visit Diagnoses    Well woman exam with routine gynecological exam    -  Primary   Relevant Medications   levonorgestrel-ethinyl estradiol (AVIANE) 0.1-20 MG-MCG tablet   Other Relevant Orders   Cytology - PAP   MM DIGITAL SCREENING BILATERAL   Screening for cervical cancer       Relevant Orders   Cytology - PAP   Breast screening       Relevant Orders   MM DIGITAL SCREENING BILATERAL   Irregular menstrual bleeding       Relevant Medications   levonorgestrel-ethinyl estradiol (AVIANE) 0.1-20 MG-MCG tablet   Encounter for surveillance of contraceptive pills       Relevant Medications   levonorgestrel-ethinyl estradiol (AVIANE) 0.1-20 MG-MCG tablet      1) Mammogram - recommend yearly screening mammogram.  Mammogram Was ordered today  2) STI screening  was offered and declined  3) ASCCP guidelines and rationale discussed.  Patient opts for every 3 years screening interval  4) Contraception - the patient is currently using  OCP (estrogen/progesterone).  She is interested in changing to higher dose estrogen for better cycle control  5) Colonoscopy: patient declines -- Screening recommended starting at age 15 for average risk individuals, age 51 for individuals  deemed at increased risk (including African Americans) and recommended to continue until age 9.  For patient age 48-85 individualized approach is recommended.  Gold standard screening is via colonoscopy, Cologuard screening is an acceptable alternative for patient unwilling or unable to undergo colonoscopy.  "Colorectal cancer screening for average?risk adults: 2018 guideline update from the American Cancer Society"CA: A Cancer Journal for Clinicians: Dec 27, 2016   6) Routine healthcare maintenance including cholesterol, diabetes screening discussed Declines  7) Return in about 1 year (around 09/15/2021) for annual established gyn.   Rod Can, [facility] 09/15/2020, 4:24 PM

## 2020-09-17 LAB — CYTOLOGY - PAP
Adequacy: ABSENT
Comment: NEGATIVE
Diagnosis: NEGATIVE
High risk HPV: NEGATIVE

## 2020-11-10 ENCOUNTER — Other Ambulatory Visit: Payer: Self-pay

## 2020-11-10 ENCOUNTER — Ambulatory Visit (INDEPENDENT_AMBULATORY_CARE_PROVIDER_SITE_OTHER): Payer: BC Managed Care – PPO | Admitting: Advanced Practice Midwife

## 2020-11-10 ENCOUNTER — Encounter: Payer: Self-pay | Admitting: Advanced Practice Midwife

## 2020-11-10 VITALS — BP 118/70 | Ht 67.0 in | Wt 159.2 lb

## 2020-11-10 DIAGNOSIS — N920 Excessive and frequent menstruation with regular cycle: Secondary | ICD-10-CM | POA: Diagnosis not present

## 2020-11-10 NOTE — Progress Notes (Signed)
Patient ID: Cassandra Macias, female   DOB: Sep 07, 1970, 50 y.o.   MRN: [id number]  Reason for Consult: Gynecologic Exam (Excessive bleeding  and clots)    Subjective:  HPI:   Cassandra Macias is a 50 y.o. female being seen for very heavy period this month after changing pills last month on March 13th. She is no longer having weekly bleeding as she was 2 months ago, although her period in the past week was heavier with more and bigger clots than she has ever had. She was soaking through a tampon and pad every hour for 3 of the 5 days of her period. She also reports larger than quarter size clots for 3 days. We will proceed with gyn ultrasound and follow up visit and she may need MD visit to discuss treatment options.   Past Medical History:  Diagnosis Date  . History of abnormal cervical Pap smear    Family History  Problem Relation Age of Onset  . Breast cancer Mother 60  . COPD Father   . Diabetes Father        type 2  . Hypertension Father   . Diabetes type I Sister        1/2 sister  . Diabetes Brother        type 2   Past Surgical History:  Procedure Laterality Date  . CESAREAN SECTION  2004, 2008   [facility]  . COLPOSCOPY  2009  . HYSTEROSCOPY WITH D & C  2014   [facility])  . INDUCED ABORTION      Short Social History:  Social History   Tobacco Use  . Smoking status: Never Smoker  . Smokeless tobacco: Never Used  Substance Use Topics  . Alcohol use: Yes    No Known Allergies  Current Outpatient Medications  Medication Sig Dispense Refill  . levonorgestrel-ethinyl estradiol (AVIANE) 0.1-20 MG-MCG tablet Take 1 tablet by mouth daily. 90 tablet 3   No current facility-administered medications for this visit.    Review of Systems  Constitutional: Negative for chills and fever.  HENT: Negative for congestion, ear discharge, ear pain, hearing loss, sinus pain and sore throat.   Eyes: Negative for blurred vision and double vision.  Respiratory:  Negative for cough, shortness of breath and wheezing.   Cardiovascular: Negative for chest pain, palpitations and leg swelling.  Gastrointestinal: Negative for abdominal pain, blood in stool, constipation, diarrhea, heartburn, melena, nausea and vomiting.  Genitourinary: Negative for dysuria, flank pain, frequency, hematuria and urgency.  Musculoskeletal: Negative for back pain, joint pain and myalgias.  Skin: Negative for itching and rash.  Neurological: Negative for dizziness, tingling, tremors, sensory change, speech change, focal weakness, seizures, loss of consciousness, weakness and headaches.  Endo/Heme/Allergies: Negative for environmental allergies. Does not bruise/bleed easily.       Positive for heavy menstrual bleeding  Psychiatric/Behavioral: Negative for depression, hallucinations, memory loss, substance abuse and suicidal ideas. The patient is not nervous/anxious and does not have insomnia.         Objective:  Objective   Vitals:   11/10/20 1622  BP: 118/70  Weight: 159 lb 3.2 oz (72.2 kg)  Height: 5\' 7"  (1.702 m)   Body mass index is 24.93 kg/m. Constitutional: Well nourished, well developed female in no acute distress.  HEENT: normal Skin: Warm and dry.  Cardiovascular: Regular rate and rhythm.   Extremity: no edema  Respiratory: Clear to auscultation bilateral. Normal respiratory effort Neuro: DTRs 2+, Cranial nerves grossly intact  Psych: Alert and Oriented x3. No memory deficits. Normal mood and affect.  MS: normal gait, normal bilateral lower extremity ROM/strength/stability.    Assessment/Plan:     50 y.o. G3 P18 female with menorrhagia/clots  Gyn ultrasound with follow up in 1 week Follow up as needed with MD after ultrasound     [facility] 11/10/2020, 5:00 PM

## 2020-11-17 ENCOUNTER — Encounter: Payer: Self-pay | Admitting: Obstetrics

## 2020-11-17 ENCOUNTER — Other Ambulatory Visit: Payer: Self-pay

## 2020-11-17 ENCOUNTER — Ambulatory Visit (INDEPENDENT_AMBULATORY_CARE_PROVIDER_SITE_OTHER): Payer: BC Managed Care – PPO

## 2020-11-17 ENCOUNTER — Ambulatory Visit (INDEPENDENT_AMBULATORY_CARE_PROVIDER_SITE_OTHER): Payer: BC Managed Care – PPO | Admitting: Obstetrics

## 2020-11-17 VITALS — BP 120/80 | Ht 67.0 in | Wt 158.0 lb

## 2020-11-17 DIAGNOSIS — N920 Excessive and frequent menstruation with regular cycle: Secondary | ICD-10-CM

## 2020-11-17 DIAGNOSIS — N926 Irregular menstruation, unspecified: Secondary | ICD-10-CM

## 2020-11-17 NOTE — Progress Notes (Signed)
Obstetrics & Gynecology Office Visit   Chief Complaint:  Chief Complaint  Patient presents with  . Follow-up    History of Present Illness:Cassandra Macias presents for a GYN ultrasound after an episode of heavy vaginal bleeding that went on for many days last month. She saw Rod Can to address the heavy cycle and was placed on a different OCP that contained a higher dose of Estradiol. Today, she has had the pelvic sono to evaluate.  Her bleeding did  stop after a number of days, and she has not had any other episodes since that one. [name] relates a past hx of heavy bleeding that resulted in a D and C with an attempt at ablatio  That was unsuccessful. According to the patient, her uterus was so retroverted that the ablation was impossible. She shares her relief that the bleeding episode did end and that she has had no reoccurrence. She would like to continue to contracept with OCPs.    Review of Systems:  Review of Systems  Constitutional: Negative.   HENT: Negative.   Eyes: Negative.   Respiratory: Negative.   Cardiovascular: Negative.   Gastrointestinal: Negative.   Genitourinary: Negative.   Musculoskeletal: Negative.   Skin: Negative.   Neurological: Negative.   Endo/Heme/Allergies: Negative.   Psychiatric/Behavioral: Negative.      Past Medical History:  Past Medical History:  Diagnosis Date  . History of abnormal cervical Pap smear     Past Surgical History:  Past Surgical History:  Procedure Laterality Date  . CESAREAN SECTION  2004, 2008   [facility]  . COLPOSCOPY  2009  . HYSTEROSCOPY WITH D & C  2014   [facility])  . INDUCED ABORTION      Gynecologic History: Patient's last menstrual period was 11/04/2020.  Obstetric History: [id number]  Family History:  Family History  Problem Relation Age of Onset  . Breast cancer Mother 62  . COPD Father   . Diabetes Father        type 2  . Hypertension Father   . Diabetes type I Sister        1/2 sister  .  Diabetes Brother        type 2    Social History:  Social History   Socioeconomic History  . Marital status: Married    Spouse name: Not on file  . Number of children: 2  . Years of education: 45  . Highest education level: Not on file  Occupational History  . Occupation: TEACHER    Comment: [employer]  Tobacco Use  . Smoking status: Never Smoker  . Smokeless tobacco: Never Used  Vaping Use  . Vaping Use: Never used  Substance and Sexual Activity  . Alcohol use: Yes  . Drug use: No  . Sexual activity: Yes    Birth control/protection: Pill  Other Topics Concern  . Not on file  Social History Narrative  . Not on file   Social Determinants of Health   Financial Resource Strain: Not on file  Food Insecurity: Not on file  Transportation Needs: Not on file  Physical Activity: Not on file  Stress: Not on file  Social Connections: Not on file  Intimate Partner Violence: Not on file    Allergies:  No Known Allergies  Medications: Prior to Admission medications   Medication Sig Start Date End Date Taking? Authorizing Provider  levonorgestrel-ethinyl estradiol (AVIANE) 0.1-20 MG-MCG tablet Take 1 tablet by mouth daily. 09/15/20  Yes Rod Can,  CNM    Physical Exam  Limited today Vitals:  Vitals:   11/17/20 1632  BP: 120/80   Patient's last menstrual period was 11/04/2020.  Physical Exam Constitutional:      Appearance: Normal appearance.  HENT:     Head: Normocephalic and atraumatic.  Cardiovascular:     Rate and Rhythm: Normal rate and regular rhythm.     Pulses: Normal pulses.     Heart sounds: Normal heart sounds.  Pulmonary:     Effort: Pulmonary effort is normal.     Breath sounds: Normal breath sounds.  Genitourinary:    Comments: Deferred today Musculoskeletal:        General: Normal range of motion.  Skin:    General: Skin is warm and dry.  Neurological:     General: No focal deficit present.     Mental Status: She is alert  and oriented to person, place, and time.  Psychiatric:        Mood and Affect: Mood normal.        Behavior: Behavior normal.    limited today   Assessment: 50 y.o. [id number]  For follow up on menorrhagia and pelvic ultrasound   Plan: Problem List Items Addressed This Visit   None   Visit Diagnoses    Irregular menstrual bleeding    -  Primary   Menorrhagia with regular cycle         Reviewed the ultrasound report today- which shows multiple fibroids and an endometrial lining that is WNL. Discussed option of consultation with MD regarding another ablation attempt  As well as continuing on her new OCPs. She will make an appointment for consultation with Drs Glennon Mac or Georgianne Fick to address options.  Imagene Riches, CNM  11/17/2020 5:11 PM  .

## 2020-12-15 ENCOUNTER — Other Ambulatory Visit: Payer: Self-pay

## 2020-12-15 ENCOUNTER — Encounter: Payer: Self-pay | Admitting: Obstetrics and Gynecology

## 2020-12-15 ENCOUNTER — Ambulatory Visit (INDEPENDENT_AMBULATORY_CARE_PROVIDER_SITE_OTHER): Payer: BC Managed Care – PPO | Admitting: Obstetrics and Gynecology

## 2020-12-15 VITALS — BP 127/74 | Ht 67.0 in | Wt 155.0 lb

## 2020-12-15 DIAGNOSIS — D250 Submucous leiomyoma of uterus: Secondary | ICD-10-CM

## 2020-12-15 DIAGNOSIS — D252 Subserosal leiomyoma of uterus: Secondary | ICD-10-CM | POA: Diagnosis not present

## 2020-12-15 DIAGNOSIS — N921 Excessive and frequent menstruation with irregular cycle: Secondary | ICD-10-CM

## 2020-12-15 DIAGNOSIS — D251 Intramural leiomyoma of uterus: Secondary | ICD-10-CM

## 2020-12-15 NOTE — Progress Notes (Signed)
Obstetrics & Gynecology Office Visit   Chief Complaint  Patient presents with  . Follow-up  Heavy menses  History of Present Illness: 50 y.o. Cassandra Macias who presents to discuss heavy periods.  Starting in December she started having breakthrough bleeding. At the time she was taking a low-dose oral contraceptive.  In February this year she mentioned this issue at her annual appointment she was changed to a higher dose pill. She quickly began having very heavy periods, soaking a tampon hourly.  Her bleeding can last for 2 weeks continuously.  She will have some weeks with no bleeding.  She did have an attempt at endometrial ablation, which was abandoned due to the tilt of her uterus. She did have a D&C.  Typically, her periods aren't heavy, they just don't stop. She had that one episode with the very heavy bleeding.  Prior to December, she had pretty light periods and mostly regular. She would correlate some intermenstrual bleeding to high-intensity activity.    Pap smear: 09/15/2020: NILM, HPV neg Pelvic ultrasound:  Multiple fibroids, possibly one within the endometrial canal (see picture below) Endometrial biopsy: none evident.  Past Medical History:  Diagnosis Date  . History of abnormal cervical Pap smear     Past Surgical History:  Procedure Laterality Date  . CESAREAN SECTION  2004, 2008   [facility]  . COLPOSCOPY  2009  . HYSTEROSCOPY WITH D & C  2014   [facility])  . INDUCED ABORTION      Gynecologic History: No LMP recorded. (Menstrual status: Irregular Periods).  Obstetric History: [id number]  Family History  Problem Relation Age of Onset  . Breast cancer Mother 65  . COPD Father   . Diabetes Father        type 2  . Hypertension Father   . Diabetes type I Sister        1/2 sister  . Diabetes Brother        type 2    Social History   Socioeconomic History  . Marital status: Married    Spouse name: Not on file  . Number of children: 2  . Years of  education: 6  . Highest education level: Not on file  Occupational History  . Occupation: TEACHER    Comment: [employer]  Tobacco Use  . Smoking status: Never Smoker  . Smokeless tobacco: Never Used  Vaping Use  . Vaping Use: Never used  Substance and Sexual Activity  . Alcohol use: Yes  . Drug use: No  . Sexual activity: Yes    Birth control/protection: Pill  Other Topics Concern  . Not on file  Social History Narrative  . Not on file   Social Determinants of Health   Financial Resource Strain: Not on file  Food Insecurity: Not on file  Transportation Needs: Not on file  Physical Activity: Not on file  Stress: Not on file  Social Connections: Not on file  Intimate Partner Violence: Not on file    No Known Allergies  Prior to Admission medications   Medication Sig Start Date End Date Taking? Authorizing Provider  levonorgestrel-ethinyl estradiol (AVIANE) 0.1-20 MG-MCG tablet Take 1 tablet by mouth daily. 09/15/20  Yes Rod Can, CNM    Review of Systems  Constitutional: Negative.   HENT: Negative.   Eyes: Negative.   Respiratory: Negative.   Cardiovascular: Negative.   Gastrointestinal: Negative.   Genitourinary: Negative.   Musculoskeletal: Negative.   Skin: Negative.   Neurological: Negative.   Psychiatric/Behavioral:  Negative.      Physical Exam BP 127/74   Ht 5\' 7"  (1.702 m)   Wt 155 lb (70.3 kg)   BMI 24.28 kg/m  No LMP recorded. (Menstrual status: Irregular Periods). Physical Exam Constitutional:      General: She is not in acute distress.    Appearance: Normal appearance.  HENT:     Head: Normocephalic and atraumatic.  Eyes:     General: No scleral icterus.    Conjunctiva/sclera: Conjunctivae normal.  Neurological:     General: No focal deficit present.     Mental Status: She is alert and oriented to person, place, and time.     Cranial Nerves: No cranial nerve deficit.  Psychiatric:        Mood and Affect: Mood normal.         Behavior: Behavior normal.        Judgment: Judgment normal.    Female chaperone present for pelvic and breast  portions of the physical exam    Submucosal fibroid  Assessment: 50 y.o. [id number] female here for  1. Menorrhagia with irregular cycle   2. Intramural, submucous, and subserous leiomyoma of uterus      Plan:   ICD-10-CM   1. Menorrhagia with irregular cycle  N92.1   2. Intramural, submucous, and subserous leiomyoma of uterus  D25.1    D25.0    D25.2      Discussed different treatment options.  Option 1 would be to do nothing at this time and to continue to monitor clinically.  The second option would be to try medication management with an increase in dose of estrogen to 35 mcg, up from 20 mcg.  The third option would be to treat surgically.  Based on the ultrasound there does appear to be an encroaching mass within the uterine cavity, most consistent with a fibroid.  Based on her operative report from 2014, there was what appeared to be a septum like lesion noted.  There is some chance that the image noted on ultrasound is this septum.  However, given the shape of this lesion, it appears far more likely to be a fibroid.  The size of this fibroid is amenable to resection.  An endometrial biopsy could be performed at the same time.  I believe this would be the minimal amount of surgery to get the most impact and affect her symptoms and the most positive way.  We did briefly discuss hysterectomy.  She states that during her second C-section she was supposed to have a tubal ligation.  She was told that she had such significant scar tissue that the tubal ligation was unable to be performed.  In favor of doing the least amount of surgery necessary, a mutual decision was made to proceed with hysteroscopy, dilation and curettage, resection of uterine fibroid (submucosal).  A total of 24 minutes were spent face-to-face with the patient as well as preparation, review, communication,  and documentation during this encounter.    Prentice Docker, MD 12/15/2020 4:19 PM

## 2020-12-20 ENCOUNTER — Telehealth: Payer: Self-pay

## 2020-12-20 NOTE — Telephone Encounter (Signed)
-----   Message from Will Bonnet, MD sent at 12/15/2020  5:26 PM EDT ----- Regarding: Schedule surgery Surgery Booking Request Patient Full Name:  Cassandra Macias  MRN: [id number]  DOB: [birth year]  Surgeon: Prentice Docker, MD  Requested Surgery Date and Time: 01/11/2021, if possible Primary Diagnosis AND Code:  1) Menorrhagia with irregular cycle [N92.1] 2) Intramural, Submucous, and subserous leiomyoma of the uterus [D25.1, D25.0, D25.2] Secondary Diagnosis and Code:  Surgical Procedure: Hysteroscopy, dilation and curettage, submucosal myomectomy RNFA Requested?: No L&D Notification: No Admission Status: same day surgery Length of Surgery: 50 min Special Case Needs: MyoSure H&P: Yes Phone Interview???:  Yes Interpreter: No Medical Clearance:  No Special Scheduling Instructions: No Any known health/anesthesia issues, diabetes, sleep apnea, latex allergy, defibrillator/pacemaker?: No Acuity: P3   (P1 highest, P2 delay may cause harm, P3 low, elective gyn, P4 lowest)

## 2020-12-20 NOTE — Telephone Encounter (Signed)
Called patient to schedule hysteroscopy D&C w Kendra Opitz 6/14  H&P 6/9 @ 3:10 [place] phone call appointment to be requested - date and time will be included on H&P paper work. Also all appointments will be updated on pt MyChart. Explained that this appointment has a call window. Based on the time scheduled will indicate if the call will be received within a 4 hour window before 1:00 or after.  Advised that pt may also receive calls from the hospital pharmacy and pre-service center.  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 No secondary insurance.

## 2021-01-06 ENCOUNTER — Ambulatory Visit (INDEPENDENT_AMBULATORY_CARE_PROVIDER_SITE_OTHER): Payer: BC Managed Care – PPO | Admitting: Obstetrics and Gynecology

## 2021-01-06 ENCOUNTER — Encounter: Payer: Self-pay | Admitting: Obstetrics and Gynecology

## 2021-01-06 ENCOUNTER — Other Ambulatory Visit: Payer: Self-pay

## 2021-01-06 VITALS — BP 129/69 | HR 65 | Ht 67.0 in | Wt 150.6 lb

## 2021-01-06 DIAGNOSIS — D252 Subserosal leiomyoma of uterus: Secondary | ICD-10-CM

## 2021-01-06 DIAGNOSIS — D250 Submucous leiomyoma of uterus: Secondary | ICD-10-CM

## 2021-01-06 DIAGNOSIS — D251 Intramural leiomyoma of uterus: Secondary | ICD-10-CM

## 2021-01-06 DIAGNOSIS — N921 Excessive and frequent menstruation with irregular cycle: Secondary | ICD-10-CM

## 2021-01-06 NOTE — Progress Notes (Signed)
Preoperative History and Physical  Cassandra Macias is a 50 y.o. [id number] here for surgical management of menorrhagia with irregular cycle and fibroid uterus with submucosal component.   No significant preoperative concerns.  History of Present Illness: 50 y.o. Cassandra Macias who presents to discuss heavy periods.  Starting in December she started having breakthrough bleeding. At the time she was taking a low-dose oral contraceptive.  In February this year she mentioned this issue at her annual appointment she was changed to a higher dose pill. She quickly began having very heavy periods, soaking a tampon hourly.  Her bleeding can last for 2 weeks continuously.  She will have some weeks with no bleeding.  She did have an attempt at endometrial ablation, which was abandoned due to the tilt of her uterus. She did have a D&C. Typically, her periods aren't heavy, they just don't stop. She had that one episode with the very heavy bleeding. Prior to December, she had pretty light periods and mostly regular. She would correlate some intermenstrual bleeding to high-intensity activity.     Pap smear: 09/15/2020: NILM, HPV neg Pelvic ultrasound: Multiple fibroids, possibly one within the endometrial canal (see picture below) Endometrial biopsy: none evident.  Proposed surgery: Hysteroscopy, dilation and curettage, submucosal myomectomy  Past Medical History:  Diagnosis Date   History of abnormal cervical Pap smear    Past Surgical History:  Procedure Laterality Date   CESAREAN SECTION  2004, 2008   [facility]   COLPOSCOPY  2009   HYSTEROSCOPY WITH D & C  2014   [facility] ([facility])   INDUCED ABORTION     OB History  Gravida Para Term Preterm AB Living  3 2 2   1 2   SAB IAB Ectopic Multiple Live Births  0 1     2    # Outcome Date GA Lbr Len/2nd Weight Sex Delivery Anes PTL Lv  3 Term 06/20/07   5 lb 4 oz (2.381 kg) F CS-LTranv   LIV     Birth Comments: REPEAT C/S D/T PREVIOUS C/S  2 Term 09/30/02   7  lb 6 oz (3.345 kg) F CS-LTranv   LIV     Birth Comments: breech  1 IAB           Patient denies any other pertinent gynecologic issues.   Current Outpatient Medications on File Prior to Visit  Medication Sig Dispense Refill   levonorgestrel-ethinyl estradiol (AVIANE) 0.1-20 MG-MCG tablet Take 1 tablet by mouth daily. 90 tablet 3   No current facility-administered medications on file prior to visit.   No Known Allergies  Social History:   reports that she has never smoked. She has never used smokeless tobacco. She reports current alcohol use. She reports that she does not use drugs.  Family History  Problem Relation Age of Onset   Breast cancer Mother 34   COPD Father    Diabetes Father        type 2   Hypertension Father    Diabetes type I Sister        1/2 sister   Diabetes Brother        type 2    Review of Systems: Noncontributory  PHYSICAL EXAM: Blood pressure 129/69, pulse 65, height 5\' 7"  (1.702 m), weight 150 lb 9.6 oz (68.3 kg). CONSTITUTIONAL: Well-developed, well-nourished female in no acute distress.  HENT:  Normocephalic, atraumatic, External right and left ear normal. Oropharynx is clear and moist EYES: Conjunctivae and EOM are normal. Pupils are  equal, round, and reactive to light. No scleral icterus.  NECK: Normal range of motion, supple, no masses SKIN: Skin is warm and dry. No rash noted. Not diaphoretic. No erythema. No pallor. [place]: Alert and oriented to person, place, and time. Normal reflexes, muscle tone coordination. No cranial nerve deficit noted. PSYCHIATRIC: Normal mood and affect. Normal behavior. Normal judgment and thought content. CARDIOVASCULAR: Normal heart rate noted, regular rhythm RESPIRATORY: Effort and breath sounds normal, no problems with respiration noted ABDOMEN: Soft, nontender, nondistended. PELVIC: Deferred MUSCULOSKELETAL: Normal range of motion. No edema and no tenderness. 2+ distal pulses.  Labs: No results found for  this or any previous visit (from the past 336 hour(s)).  Imaging Studies: No results found.  Assessment:   ICD-10-CM   1. Menorrhagia with irregular cycle  N92.1     2. Intramural, submucous, and subserous leiomyoma of uterus  D25.1    D25.0    D25.2         Plan: Patient will undergo surgical management with the above-noted surgery.   The risks of surgery were discussed in detail with the patient including but not limited to: bleeding which may require transfusion or reoperation; infection which may require antibiotics; injury to surrounding organs which may involve bowel, bladder, ureters ; need for additional procedures including laparoscopy or laparotomy; thromboembolic phenomenon, surgical site problems and other postoperative/anesthesia complications. Likelihood of success in alleviating the patient's condition was discussed. Routine postoperative instructions will be reviewed with the patient and her family in detail after surgery.  The patient concurred with the proposed plan, giving informed written consent for the surgery.  Preoperative prophylactic antibiotics, as indicated, and SCDs ordered on call to the OR.    Prentice Docker, MD 01/06/2021 3:32 PM

## 2021-01-06 NOTE — H&P (View-Only) (Signed)
Preoperative History and Physical  Cassandra Macias is a 50 y.o. [id number] here for surgical management of menorrhagia with irregular cycle and fibroid uterus with submucosal component.   No significant preoperative concerns.  History of Present Illness: 50 y.o. [id number] who presents to discuss heavy periods.  Starting in December she started having breakthrough bleeding. At the time she was taking a low-dose oral contraceptive.  In February this year she mentioned this issue at her annual appointment she was changed to a higher dose pill. She quickly began having very heavy periods, soaking a tampon hourly.  Her bleeding can last for 2 weeks continuously.  She will have some weeks with no bleeding.  She did have an attempt at endometrial ablation, which was abandoned due to the tilt of her uterus. She did have a D&C. Typically, her periods aren't heavy, they just don't stop. She had that one episode with the very heavy bleeding. Prior to December, she had pretty light periods and mostly regular. She would correlate some intermenstrual bleeding to high-intensity activity.     Pap smear: 09/15/2020: NILM, HPV neg Pelvic ultrasound: Multiple fibroids, possibly one within the endometrial canal (see picture below) Endometrial biopsy: none evident.  Proposed surgery: Hysteroscopy, dilation and curettage, submucosal myomectomy  Past Medical History:  Diagnosis Date   History of abnormal cervical Pap smear    Past Surgical History:  Procedure Laterality Date   CESAREAN SECTION  2004, 2008   [facility]   COLPOSCOPY  2009   HYSTEROSCOPY WITH D & C  2014   [facility] ([facility])   INDUCED ABORTION     OB History  Gravida Para Term Preterm AB Living  3 2 2   1 2   SAB IAB Ectopic Multiple Live Births  0 1     2    # Outcome Date GA Lbr Len/2nd Weight Sex Delivery Anes PTL Lv  3 Term 06/20/07   5 lb 4 oz (2.381 kg) F CS-LTranv   LIV     Birth Comments: REPEAT C/S D/T PREVIOUS C/S  2 Term 09/30/02   7  lb 6 oz (3.345 kg) F CS-LTranv   LIV     Birth Comments: breech  1 IAB           Patient denies any other pertinent gynecologic issues.   Current Outpatient Medications on File Prior to Visit  Medication Sig Dispense Refill   levonorgestrel-ethinyl estradiol (AVIANE) 0.1-20 MG-MCG tablet Take 1 tablet by mouth daily. 90 tablet 3   No current facility-administered medications on file prior to visit.   No Known Allergies  Social History:   reports that she has never smoked. She has never used smokeless tobacco. She reports current alcohol use. She reports that she does not use drugs.  Family History  Problem Relation Age of Onset   Breast cancer Mother 38   COPD Father    Diabetes Father        type 2   Hypertension Father    Diabetes type I Sister        1/2 sister   Diabetes Brother        type 2    Review of Systems: Noncontributory  PHYSICAL EXAM: Blood pressure 129/69, pulse 65, height 5\' 7"  (1.702 m), weight 150 lb 9.6 oz (68.3 kg). CONSTITUTIONAL: Well-developed, well-nourished female in no acute distress.  HENT:  Normocephalic, atraumatic, External right and left ear normal. Oropharynx is clear and moist EYES: Conjunctivae and EOM are normal. Pupils are  equal, round, and reactive to light. No scleral icterus.  NECK: Normal range of motion, supple, no masses SKIN: Skin is warm and dry. No rash noted. Not diaphoretic. No erythema. No pallor. [place]: Alert and oriented to person, place, and time. Normal reflexes, muscle tone coordination. No cranial nerve deficit noted. PSYCHIATRIC: Normal mood and affect. Normal behavior. Normal judgment and thought content. CARDIOVASCULAR: Normal heart rate noted, regular rhythm RESPIRATORY: Effort and breath sounds normal, no problems with respiration noted ABDOMEN: Soft, nontender, nondistended. PELVIC: Deferred MUSCULOSKELETAL: Normal range of motion. No edema and no tenderness. 2+ distal pulses.  Labs: No results found for  this or any previous visit (from the past 336 hour(s)).  Imaging Studies: No results found.  Assessment:   ICD-10-CM   1. Menorrhagia with irregular cycle  N92.1     2. Intramural, submucous, and subserous leiomyoma of uterus  D25.1    D25.0    D25.2         Plan: Patient will undergo surgical management with the above-noted surgery.   The risks of surgery were discussed in detail with the patient including but not limited to: bleeding which may require transfusion or reoperation; infection which may require antibiotics; injury to surrounding organs which may involve bowel, bladder, ureters ; need for additional procedures including laparoscopy or laparotomy; thromboembolic phenomenon, surgical site problems and other postoperative/anesthesia complications. Likelihood of success in alleviating the patient's condition was discussed. Routine postoperative instructions will be reviewed with the patient and her family in detail after surgery.  The patient concurred with the proposed plan, giving informed written consent for the surgery.  Preoperative prophylactic antibiotics, as indicated, and SCDs ordered on call to the OR.    Prentice Docker, MD 01/06/2021 3:32 PM

## 2021-01-07 ENCOUNTER — Other Ambulatory Visit: Payer: Self-pay

## 2021-01-07 ENCOUNTER — Encounter
Admission: RE | Admit: 2021-01-07 | Discharge: 2021-01-07 | Disposition: A | Discharge status: Home / Self Care | Payer: BC Managed Care – PPO | Source: Ambulatory Visit | Attending: Obstetrics and Gynecology | Admitting: Obstetrics and Gynecology

## 2021-01-07 NOTE — Patient Instructions (Addendum)
INSTRUCTIONS FOR SURGERY     Your surgery is scheduled for:   Tuesday, June 14TH     To find out your arrival time for the day of surgery,          please call [phone] between 1 pm and 3 pm on :  Monday, June 13TH     When you arrive for surgery, report to the [facility]. ONCE THEY HAVE COMPLETED THEIR PROCESS, THEN PROCEED  TO THE SECOND FLOOR AND SIGN IN AT THE SURGERY DESK.    REMEMBER: Instructions that are not followed completely may result in serious medical risk,  up to and including death, or upon the discretion of your surgeon and anesthesiologist,            your surgery may need to be rescheduled.  __X__ 1. Do not eat food after midnight the night before your procedure.                    No gum, candy, lozenger, tic tacs, tums or hard candies.                  ABSOLUTELY NOTHING SOLID IN YOUR MOUTH AFTER MIDNIGHT                    You may drink unlimited clear liquids up to 2 hours before you are scheduled to arrive for surgery.                   Do not drink anything within those 2 hours unless you need to take medicine, then take the                   smallest amount you need.  Clear liquids include:  water, apple juice without pulp,                   any flavor Gatorade, Black coffee, black tea.  Sugar may be added but no dairy/ honey /lemon.                        Broth and jello is not considered a clear liquid.  __x__  2. On the morning of surgery, please brush your teeth with toothpaste and water. You may rinse with                  mouthwash if you wish but DO NOT SWALLOW TOOTHPASTE OR MOUTHWASH  __X___3. NO alcohol for 24 hours before or after surgery.  __x___ 4.  Do NOT smoke or use e-cigarettes for 24 HOURS PRIOR TO SURGERY.                      DO NOT  Use any chewable tobacco products for at least 6 hours prior to surgery.  __x___ 5. If you start any  new medication after this appointment and prior to surgery, please                   Bring it with you on the day of surgery.  ___x__  6. Notify your doctor if there is any change in your medical condition, such as fever,                   infection, vomitting, diarrhea or any open sores.  __x___ 7.  USE ANTIBACTERIAL SOAP as instructed, the night before surgery and the day of surgery.                   Once you have washed with this soap, do NOT use any of the following: Powders, perfumes                    or lotions. Please do not wear make up, hairpins, clips or nail polish. You may wear deodorant.                                                  Women need to shave 48 hours prior to surgery.                  DO NOT wear ANY jewelry on the day of surgery. If there are rings that are too tight to                   remove easily, please address this prior to the surgery day. Piercings need to be removed.                                                                     NO METAL ON YOUR BODY.                  Do NOT bring any valuables.  If you came to Pre-Admit testing then you will not need license,                   insurance card or credit card.  If you will be staying overnight, please either leave your things in                   the car or have your family be responsible for these items.                   [facility] IS NOT RESPONSIBLE FOR BELONGINGS OR VALUABLES.  ___X__ 8. DO NOT wear contact lenses on surgery day.  You may not have dentures,                     Hearing aides, contacts or glasses in the operating room. These items can be                    Placed in the Recovery Room to receive immediately after surgery.  __x___ 9. IF YOU ARE SCHEDULED TO GO HOME ON THE SAME DAY, YOU MUST                   Have someone to drive you home and to stay with you  for the first 24 hours.  Have an arrangement prior to arriving on surgery day.  ___x__ 10. Take the  following medications on the morning of surgery with a sip of water:                              1. NOTHING                     2   __X__  12. STOP ALL ASPIRIN PRODUCTS IMMEDIATELY. (01/07/21)                       THIS INCLUDES BC POWDERS / GOODIES POWDER  __x___ 13. STOP Anti-inflammatories IMMEDIATELY. (01/07/21)                      This includes IBUPROFEN / MOTRIN / ADVIL / ALEVE/ NAPROXYN                    YOU MAY TAKE TYLENOL ANY TIME PRIOR TO SURGERY.  _____ 66.  Stop supplements until after surgery.    ___X__18. Wear clean and comfortable clothing to the hospital.  PLEASE BRING PHONE [facility].   CONTINUE TAKING YOUR BIRTH CONTROL PILL AS USUAL, IN THE EVENING.

## 2021-01-11 ENCOUNTER — Ambulatory Visit: Payer: BC Managed Care – PPO | Admitting: Anesthesiology

## 2021-01-11 ENCOUNTER — Encounter
Admission: RE | Disposition: A | Discharge status: Home / Self Care | Payer: Self-pay | Source: Home / Self Care | Attending: Obstetrics and Gynecology

## 2021-01-11 ENCOUNTER — Encounter: Payer: Self-pay | Admitting: Obstetrics and Gynecology

## 2021-01-11 ENCOUNTER — Other Ambulatory Visit: Payer: Self-pay

## 2021-01-11 ENCOUNTER — Ambulatory Visit
Admission: RE | Admit: 2021-01-11 | Discharge: 2021-01-11 | Disposition: A | Discharge status: Home / Self Care | Payer: BC Managed Care – PPO | Attending: Obstetrics and Gynecology | Admitting: Obstetrics and Gynecology

## 2021-01-11 DIAGNOSIS — N921 Excessive and frequent menstruation with irregular cycle: Secondary | ICD-10-CM | POA: Diagnosis not present

## 2021-01-11 DIAGNOSIS — Z793 Long term (current) use of hormonal contraceptives: Secondary | ICD-10-CM | POA: Insufficient documentation

## 2021-01-11 DIAGNOSIS — D251 Intramural leiomyoma of uterus: Secondary | ICD-10-CM | POA: Diagnosis not present

## 2021-01-11 DIAGNOSIS — D252 Subserosal leiomyoma of uterus: Secondary | ICD-10-CM | POA: Insufficient documentation

## 2021-01-11 DIAGNOSIS — D250 Submucous leiomyoma of uterus: Secondary | ICD-10-CM | POA: Diagnosis not present

## 2021-01-11 HISTORY — PX: DILATATION & CURETTAGE/HYSTEROSCOPY WITH MYOSURE: SHX6511

## 2021-01-11 LAB — POCT PREGNANCY, URINE: Preg Test, Ur: NEGATIVE

## 2021-01-11 SURGERY — DILATATION & CURETTAGE/HYSTEROSCOPY WITH MYOSURE
Anesthesia: General

## 2021-01-11 MED ORDER — ORAL CARE MOUTH RINSE: 15.0000 mL | Freq: Once | OROMUCOSAL | Status: AC

## 2021-01-11 MED ORDER — LACTATED RINGERS IV SOLN: INTRAVENOUS | Status: DC

## 2021-01-11 MED ORDER — CHLORHEXIDINE GLUCONATE 0.12 % MT SOLN: 15.0000 mL | Freq: Once | OROMUCOSAL | Status: AC

## 2021-01-11 MED ORDER — SILVER NITRATE-POT NITRATE 75-25 % EX MISC
CUTANEOUS | Status: DC | PRN
  Administered 2021-01-11: 4 via TOPICAL

## 2021-01-11 MED ORDER — DEXMEDETOMIDINE (PRECEDEX) IN NS 20 MCG/5ML (4 MCG/ML) IV SYRINGE
PREFILLED_SYRINGE | INTRAVENOUS | Status: DC | PRN
  Administered 2021-01-11 (×2): 4 ug via INTRAVENOUS

## 2021-01-11 MED ORDER — PROPOFOL 10 MG/ML IV BOLUS
INTRAVENOUS | Status: DC | PRN
  Administered 2021-01-11: 150 mg via INTRAVENOUS
  Administered 2021-01-11: 50 mg via INTRAVENOUS

## 2021-01-11 MED ORDER — SODIUM CHLORIDE 0.9 % IR SOLN
Status: DC | PRN
  Administered 2021-01-11: 2200 mL

## 2021-01-11 MED ORDER — FENTANYL CITRATE (PF) 100 MCG/2ML IJ SOLN: 25.0000 ug | INTRAMUSCULAR | Status: DC | PRN

## 2021-01-11 MED ORDER — FENTANYL CITRATE (PF) 100 MCG/2ML IJ SOLN
INTRAMUSCULAR | Status: AC
  Filled 2021-01-11: qty 2

## 2021-01-11 MED ORDER — MIDAZOLAM HCL 2 MG/2ML IJ SOLN
INTRAMUSCULAR | Status: AC
  Filled 2021-01-11: qty 2

## 2021-01-11 MED ORDER — FENTANYL CITRATE (PF) 100 MCG/2ML IJ SOLN
INTRAMUSCULAR | Status: DC | PRN
  Administered 2021-01-11 (×4): 25 ug via INTRAVENOUS

## 2021-01-11 MED ORDER — ACETAMINOPHEN 10 MG/ML IV SOLN
INTRAVENOUS | Status: DC | PRN
  Administered 2021-01-11: 1000 mg via INTRAVENOUS

## 2021-01-11 MED ORDER — FAMOTIDINE 20 MG PO TABS: 20.0000 mg | ORAL_TABLET | Freq: Once | ORAL | Status: AC

## 2021-01-11 MED ORDER — MIDAZOLAM HCL 2 MG/2ML IJ SOLN
INTRAMUSCULAR | Status: DC | PRN
  Administered 2021-01-11: 2 mg via INTRAVENOUS

## 2021-01-11 MED ORDER — LIDOCAINE HCL (CARDIAC) PF 100 MG/5ML IV SOSY
PREFILLED_SYRINGE | INTRAVENOUS | Status: DC | PRN
  Administered 2021-01-11: 100 mg via INTRAVENOUS

## 2021-01-11 MED ORDER — IBUPROFEN 600 MG PO TABS: 600.0000 mg | ORAL_TABLET | Freq: Four times a day (QID) | ORAL | 0 refills | Status: DC | PRN

## 2021-01-11 MED ORDER — EPHEDRINE SULFATE 50 MG/ML IJ SOLN
INTRAMUSCULAR | Status: DC | PRN
  Administered 2021-01-11: 10 mg via INTRAVENOUS

## 2021-01-11 MED ORDER — ONDANSETRON HCL 4 MG/2ML IJ SOLN: 4.0000 mg | Freq: Once | INTRAMUSCULAR | Status: DC | PRN

## 2021-01-11 MED ORDER — FAMOTIDINE 20 MG PO TABS
ORAL_TABLET | ORAL | Status: AC
  Administered 2021-01-11: 20 mg via ORAL
  Filled 2021-01-11: qty 1

## 2021-01-11 MED ORDER — CHLORHEXIDINE GLUCONATE 0.12 % MT SOLN
OROMUCOSAL | Status: AC
  Administered 2021-01-11: 15 mL via OROMUCOSAL
  Filled 2021-01-11: qty 15

## 2021-01-11 MED ORDER — DEXAMETHASONE SODIUM PHOSPHATE 10 MG/ML IJ SOLN
INTRAMUSCULAR | Status: DC | PRN
  Administered 2021-01-11: 10 mg via INTRAVENOUS

## 2021-01-11 MED ORDER — ACETAMINOPHEN 10 MG/ML IV SOLN
INTRAVENOUS | Status: AC
  Filled 2021-01-11: qty 100

## 2021-01-11 MED ORDER — ONDANSETRON HCL 4 MG/2ML IJ SOLN
INTRAMUSCULAR | Status: DC | PRN
  Administered 2021-01-11: 4 mg via INTRAVENOUS

## 2021-01-11 MED ORDER — POVIDONE-IODINE 10 % EX SWAB
2.0000 "application " | Freq: Once | CUTANEOUS | Status: AC
  Administered 2021-01-11: 2 via TOPICAL

## 2021-01-11 MED ORDER — PROPOFOL 10 MG/ML IV BOLUS
INTRAVENOUS | Status: AC
  Filled 2021-01-11: qty 20

## 2021-01-11 SURGICAL SUPPLY — 26 items
BAG DRN RND TRDRP ANRFLXCHMBR (UROLOGICAL SUPPLIES)
BAG URINE DRAIN 2000ML AR STRL (UROLOGICAL SUPPLIES) IMPLANT
CATH FOLEY 2WAY  5CC 16FR (CATHETERS)
CATH FOLEY 2WAY 5CC 16FR (CATHETERS)
CATH ROBINSON RED A/P 16FR (CATHETERS) ×3 IMPLANT
CATH URTH 16FR FL 2W BLN LF (CATHETERS) IMPLANT
COVER WAND RF STERILE (DRAPES) ×3 IMPLANT
DEVICE MYOSURE LITE (MISCELLANEOUS) ×1 IMPLANT
DEVICE MYOSURE REACH (MISCELLANEOUS) ×3 IMPLANT
ELECT REM PT RETURN 9FT ADLT (ELECTROSURGICAL) ×3
ELECTRODE REM PT RTRN 9FT ADLT (ELECTROSURGICAL) ×1 IMPLANT
GLOVE SURG ENC MOIS LTX SZ7 (GLOVE) ×3 IMPLANT
GLOVE SURG UNDER POLY LF SZ7.5 (GLOVE) ×3 IMPLANT
GOWN STRL REUS W/ TWL LRG LVL3 (GOWN DISPOSABLE) ×2 IMPLANT
GOWN STRL REUS W/TWL LRG LVL3 (GOWN DISPOSABLE) ×6
IV NS IRRIG 3000ML ARTHROMATIC (IV SOLUTION) ×3 IMPLANT
KIT PROCEDURE FLUENT (KITS) ×3 IMPLANT
KIT TURNOVER CYSTO (KITS) ×3 IMPLANT
MANIFOLD NEPTUNE II (INSTRUMENTS) ×3 IMPLANT
PACK DNC HYST (MISCELLANEOUS) ×3 IMPLANT
PAD OB MATERNITY 4.3X12.25 (PERSONAL CARE ITEMS) ×3 IMPLANT
PAD PREP 24X41 OB/GYN DISP (PERSONAL CARE ITEMS) ×3 IMPLANT
SEAL ROD LENS SCOPE MYOSURE (ABLATOR) ×3 IMPLANT
SURGILUBE 2OZ TUBE FLIPTOP (MISCELLANEOUS) ×3 IMPLANT
TUBING CONNECTING 10 (TUBING) ×2 IMPLANT
TUBING CONNECTING 10' (TUBING) ×1

## 2021-01-11 NOTE — Anesthesia Preprocedure Evaluation (Signed)
Anesthesia Evaluation  Patient identified by MRN, date of birth, ID band Patient awake    Reviewed: Allergy & Precautions, NPO status , Patient's Chart, lab work & pertinent test results  History of Anesthesia Complications Negative for: history of anesthetic complications  Airway Mallampati: II       Dental   Pulmonary neg sleep apnea, neg COPD, Not current smoker,           Cardiovascular (-) hypertension(-) Past MI and (-) CHF (-) dysrhythmias (-) Valvular Problems/Murmurs     Neuro/Psych neg Seizures    GI/Hepatic Neg liver ROS, neg GERD  ,  Endo/Other  neg diabetes  Renal/GU negative Renal ROS     Musculoskeletal   Abdominal   Peds  Hematology   Anesthesia Other Findings   Reproductive/Obstetrics                             Anesthesia Physical Anesthesia Plan  ASA: 1  Anesthesia Plan: General   Post-op Pain Management:    Induction: Intravenous  PONV Risk Score and Plan: 3 and Ondansetron and Dexamethasone  Airway Management Planned: LMA  Additional Equipment:   Intra-op Plan:   Post-operative Plan:   Informed Consent: I have reviewed the patients History and Physical, chart, labs and discussed the procedure including the risks, benefits and alternatives for the proposed anesthesia with the patient or authorized representative who has indicated his/her understanding and acceptance.       Plan Discussed with:   Anesthesia Plan Comments:         Anesthesia Quick Evaluation

## 2021-01-11 NOTE — Interval H&P Note (Signed)
History and Physical Interval Note:  01/11/2021 9:44 AM  Cassandra Macias  has presented today for surgery, with the diagnosis of Menorrhagia with irregular cycle N92.1 Intramural, Submucous, and subserous leiomyoma of the uterus D25.1, D25.0, D25.2.  The various methods of treatment have been discussed with the patient and family. After consideration of risks, benefits and other options for treatment, the patient has consented to  Procedure(s): Mallard (N/A) and submcusal myomectomy as a surgical intervention.  The patient's history has been reviewed, patient examined, no change in status, stable for surgery.  I have reviewed the patient's chart and labs.  Questions were answered to the patient's satisfaction.     Prentice Docker, MD, [facility], [facility] 01/11/2021 9:45 AM

## 2021-01-11 NOTE — Op Note (Signed)
Operative Note    Name: Cassandra Macias  Date of Service: 01/11/2021 Date of birth: Mar 03, 1971  MRN: [id number]    PRE-OP DIAGNOSIS:  1) Menorrhagia with irregular cycle [N92.1] 2) Intramural, submucous, and subserous leiomyoma of uterus [D25.1, D25.0, D25.2)   POST-OP DIAGNOSIS:  1) Menorrhagia with irregular cycle [N92.1] 2) Intramural, submucous, and subserous leiomyoma of uterus [D25.1, D25.0, D25.2)   SURGEON: Surgeon(s) and Role:    Will Bonnet, MD - Primary  PROCEDURE: Procedure(s): 1) Hysteroscopy with dilation and curettage 2) Submucosal myomectomy  ANESTHESIA: General LMA  ESTIMATED BLOOD LOSS: 50 mL  DRAINS: none   TOTAL IV FLUIDS: 600 mL  SPECIMENS:  1) submucosal fibroids 2) endometrial curetting  VTE PROPHYLAXIS: SCDs to the bilateral lower extremities  ANTIBIOTICS: none indicated  FLUID DEFICIT: 6,301 mL  COMPLICATIONS: none  DISPOSITION: PACU - hemodynamically stable.  CONDITION: stable  FINDINGS: Exam under anesthesia revealed small, mobile retroverted uterus with no masses appreciated and bilateral adnexa without masses or fullness. Hysteroscopy revealed right posterior fibroid with a small-moderate sized base, adjacent to this was a smaller fibroid.  In the left cornual region there was a larger fibroid impinging on the uterine cavity (~5-6 cm).  The right tubal ostium was visualized and appeared normal.  The left tubal ostium was not confidently visualized. Normal appearing endocervical canal.  PROCEDURE IN DETAIL:  After informed consent was obtained, the patient was taken to the operating room where anesthesia was obtained without difficulty. The patient was positioned in the dorsal lithotomy position in [place].  She was prepped and draped in the usual sterile fashion. After a time-out, the patient's bladder was catheterized with an in and out foley catheter.  The patient was examined under anesthesia, with the above noted  findings.  The bi-valved speculum was placed inside the patient's vagina, and the the anterior lip of the cervix was grasped with the tenaculum.  The cervix was progressively dilated to a 7 mm Hegar dilator.  The hysteroscope was introduced, with the above noted findings.  Using the MyoSure Reach device the fibroid at the right posterior sidewall and smaller adjacent fibroid were removed.  The fluid deficit quickly reached 1,500 mL.  So, the hysteroscopy was stopped.  The scope was removed.  Bleeding at this point was minimal.  A curettage was performed of the entire uterine cavity.  The hysteroscope was re-introduced and after clearing small clots there were no apparent defects in the uterine wall to account for the rapid fluid deficit.  Given the rapid accumulation of fluid deficit and successful removal of the two obvious submucous fibroids, as well as the size of the fibroid that appeared to be impinging on the cavity from the left, the procedure was terminated.  The hysteroscope was removed and there was minimal-to-no bleeding from the cervical os once drainage of fluid from the uterus was achieved.  Silver nitrate was used on the tenaculum sites after removal of the tenaculum.  Hemostasis was achieved.  Verification of no instruments or sponges was obtained.  The speculum was removed from the vagina.  She was then taken out of dorsal lithotomy.  The patient tolerated the procedure well.  Sponge, lap and needle counts were correct x2.  The patient was taken to recovery room in excellent condition.  Will Bonnet, MD, [facility] 01/11/2021 11:10 AM

## 2021-01-11 NOTE — Transfer of Care (Signed)
Immediate Anesthesia Transfer of Care Note  Patient: Cassandra Macias  Procedure(s) Performed: DILATATION & CURETTAGE/HYSTEROSCOPY WITH SUBMUCOSAL MYOMECTOMY  Patient Location: PACU  Anesthesia Type:General  Level of Consciousness: sedated  Airway & Oxygen Therapy: Patient Spontanous Breathing and Patient connected to face mask oxygen  Post-op Assessment: Report given to RN and Post -op Vital signs reviewed and stable  Post vital signs: Reviewed and stable  Last Vitals:  Vitals Value Taken Time  BP 107/61 01/11/21 1115  Temp 36.2 C 01/11/21 1115  Pulse 70 01/11/21 1120  Resp 18 01/11/21 1120  SpO2 100 % 01/11/21 1120  Vitals shown include unvalidated device data.  Last Pain:  Vitals:   01/11/21 1115  TempSrc:   PainSc: Asleep         Complications: No notable events documented.

## 2021-01-11 NOTE — Discharge Instructions (Signed)

## 2021-01-11 NOTE — Anesthesia Postprocedure Evaluation (Signed)
Anesthesia Post Note  Patient: Cassandra Macias  Procedure(s) Performed: DILATATION & CURETTAGE/HYSTEROSCOPY WITH SUBMUCOSAL MYOMECTOMY  Patient location during evaluation: PACU Anesthesia Type: General Level of consciousness: awake and alert, awake and oriented Pain management: pain level controlled Vital Signs Assessment: post-procedure vital signs reviewed and stable Respiratory status: spontaneous breathing, nonlabored ventilation and respiratory function stable Cardiovascular status: blood pressure returned to baseline and stable Postop Assessment: no apparent nausea or vomiting Anesthetic complications: no   No notable events documented.   Last Vitals:  Vitals:   01/11/21 1200 01/11/21 1222  BP: 107/67 105/64  Pulse: 64 66  Resp: 15 16  Temp: (!) 36.3 C (!) 36.2 C  SpO2: 100% 100%    Last Pain:  Vitals:   01/11/21 1222  TempSrc: Temporal  PainSc: 2                  Phill Mutter

## 2021-01-11 NOTE — Anesthesia Procedure Notes (Signed)
Procedure Name: LMA Insertion Date/Time: 01/11/2021 10:00 AM Performed by: Allean Found, CRNA Pre-anesthesia Checklist: Patient identified, Patient being monitored, Timeout performed, Emergency Drugs available and Suction available Patient Re-evaluated:Patient Re-evaluated prior to induction Oxygen Delivery Method: Circle system utilized Preoxygenation: Pre-oxygenation with 100% oxygen Induction Type: IV induction Ventilation: Mask ventilation without difficulty LMA: LMA inserted LMA Size: 4.0 Tube type: Oral Number of attempts: 1 Placement Confirmation: positive ETCO2 and breath sounds checked- equal and bilateral Tube secured with: Tape Dental Injury: Teeth and Oropharynx as per pre-operative assessment

## 2021-01-12 LAB — SURGICAL PATHOLOGY

## 2021-01-13 ENCOUNTER — Ambulatory Visit
Admission: RE | Admit: 2021-01-13 | Discharge: 2021-01-13 | Disposition: A | Discharge status: Home / Self Care | Payer: BC Managed Care – PPO | Source: Ambulatory Visit | Attending: Advanced Practice Midwife | Admitting: Advanced Practice Midwife

## 2021-01-13 ENCOUNTER — Other Ambulatory Visit: Payer: Self-pay

## 2021-01-13 DIAGNOSIS — Z1239 Encounter for other screening for malignant neoplasm of breast: Secondary | ICD-10-CM

## 2021-01-13 DIAGNOSIS — Z01419 Encounter for gynecological examination (general) (routine) without abnormal findings: Secondary | ICD-10-CM

## 2021-01-13 DIAGNOSIS — Z1231 Encounter for screening mammogram for malignant neoplasm of breast: Secondary | ICD-10-CM | POA: Insufficient documentation

## 2021-01-28 ENCOUNTER — Other Ambulatory Visit: Payer: Self-pay

## 2021-01-28 ENCOUNTER — Ambulatory Visit (INDEPENDENT_AMBULATORY_CARE_PROVIDER_SITE_OTHER): Payer: BC Managed Care – PPO | Admitting: Obstetrics and Gynecology

## 2021-01-28 ENCOUNTER — Encounter: Payer: Self-pay | Admitting: Obstetrics and Gynecology

## 2021-01-28 VITALS — BP 100/60 | HR 71 | Ht 67.0 in | Wt 150.0 lb

## 2021-01-28 DIAGNOSIS — Z09 Encounter for follow-up examination after completed treatment for conditions other than malignant neoplasm: Secondary | ICD-10-CM

## 2021-01-28 NOTE — Progress Notes (Signed)
   Postoperative Follow-up Patient presents post op from hysteroscopy, dilation and curettage, submucosal myomectomy 2 weeks ago for menorrhagia with irregular cycle, fibroid uterus (some submucous components).  Subjective: Patient reports some improvement in her preop symptoms. Eating a regular diet without difficulty. The patient is not having any pain.  Activity: normal activities of daily living.  She denies fever, chills, nausea, and vomiting.   DIAGNOSIS:  A.  ENDOMETRIAL CURETTINGS; DILATATION AND CURETTAGE:  - FRAGMENTS OF ENDOCERVIX AND LOWER UTERINE SEGMENT.  - NEGATIVE FOR ATYPIA AND MALIGNANCY.   B.  SUBMUCOSAL FIBROIDS; MYOMECTOMY:  - FRAGMENTS OF BENIGN SMOOTH MUSCLE, COMPATIBLE WITH SUBMUCOSAL  LEIOMYOMA.  - FRAGMENTS OF BENIGN ENDOMETRIUM.  - NEGATIVE FOR ATYPIA / EIN AND MALIGNANCY.   Objective: Vital Signs: BP 100/60 (Cuff Size: Normal)   Pulse 71   Ht 5\' 7"  (1.702 m)   Wt 150 lb (68 kg)   BMI 23.49 kg/m  Physical Exam Constitutional:      General: She is not in acute distress.    Appearance: Normal appearance.  HENT:     Head: Normocephalic and atraumatic.  Eyes:     General: No scleral icterus.    Conjunctiva/sclera: Conjunctivae normal.  Neurological:     General: No focal deficit present.     Mental Status: She is alert and oriented to person, place, and time.     Cranial Nerves: No cranial nerve deficit.  Psychiatric:        Mood and Affect: Mood normal.        Behavior: Behavior normal.        Judgment: Judgment normal.     Assessment: 50 y.o. s/p above surgery progressing well  Plan: Patient has done well after surgery with no apparent complications.  I have discussed the post-operative course to date, and the expected progress moving forward.  The patient understands what complications to be concerned about.  I will see the patient in routine follow up, or sooner if needed.    Activity plan: No restriction.  If bleeding resumes and is  too heavy, will consider pills versus other options.   Prentice Docker, MD  01/28/2021, 3:59 PM

## 2021-07-01 IMAGING — MG MM DIGITAL SCREENING BILAT W/ TOMO AND CAD
8 series · 8 of 24 positions shown · non-contrast
Comparison: Previous exam(s).

CLINICAL DATA: Screening.

EXAM:
DIGITAL SCREENING BILATERAL MAMMOGRAM WITH TOMOSYNTHESIS AND CAD
TECHNIQUE: Bilateral screening digital craniocaudal and mediolateral oblique
mammograms were obtained. Bilateral screening digital breast
tomosynthesis was performed. The images were evaluated with
computer-aided detection.

[R CC synth-2D]
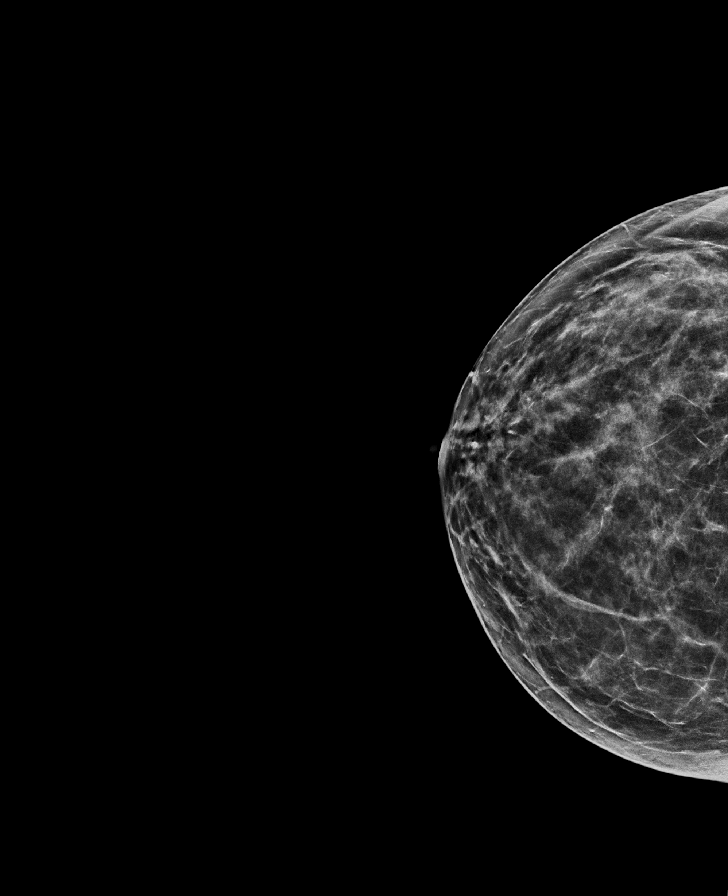

[L CC synth-2D]
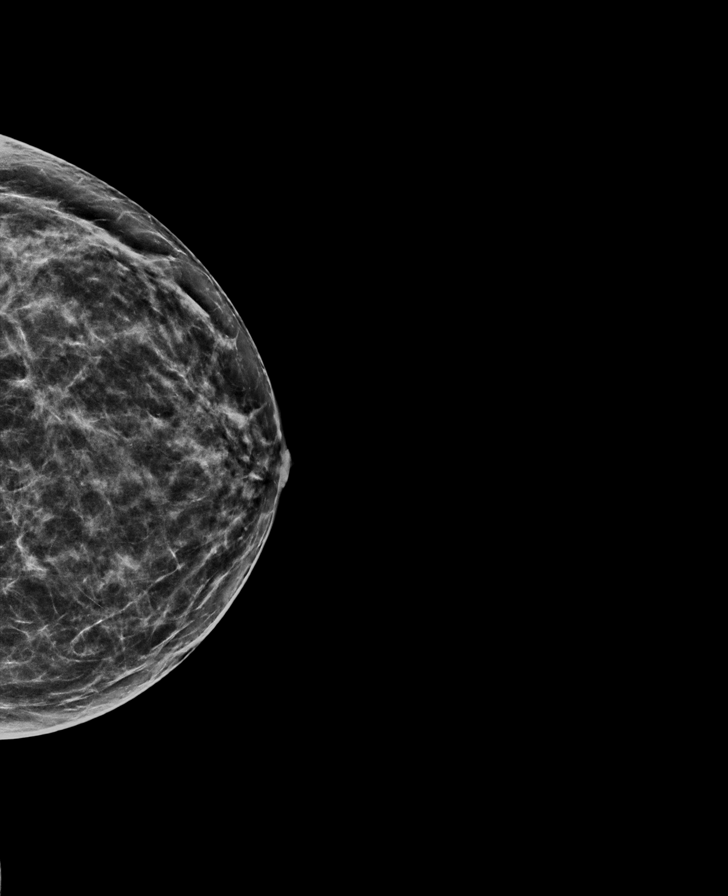

[L MLO synth-2D]
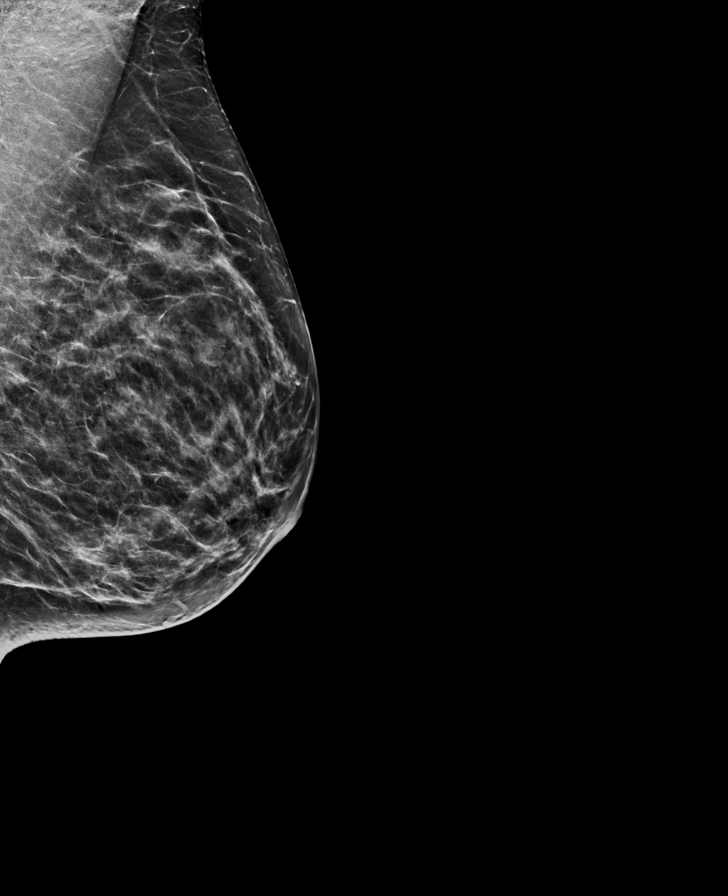

[R MLO synth-2D]
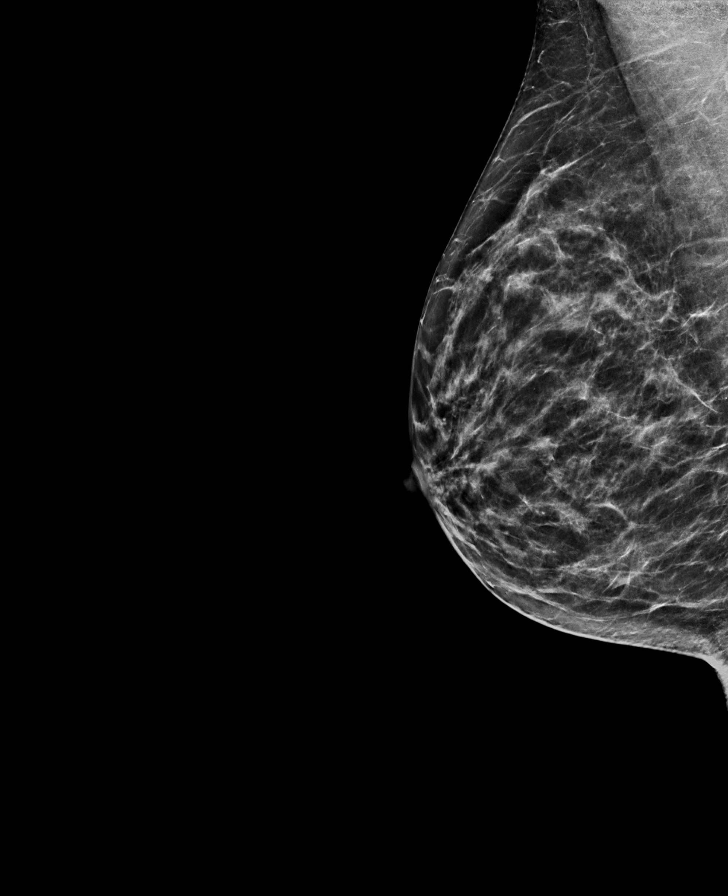

[R MLO tomo · tomo slice 33/65.0]
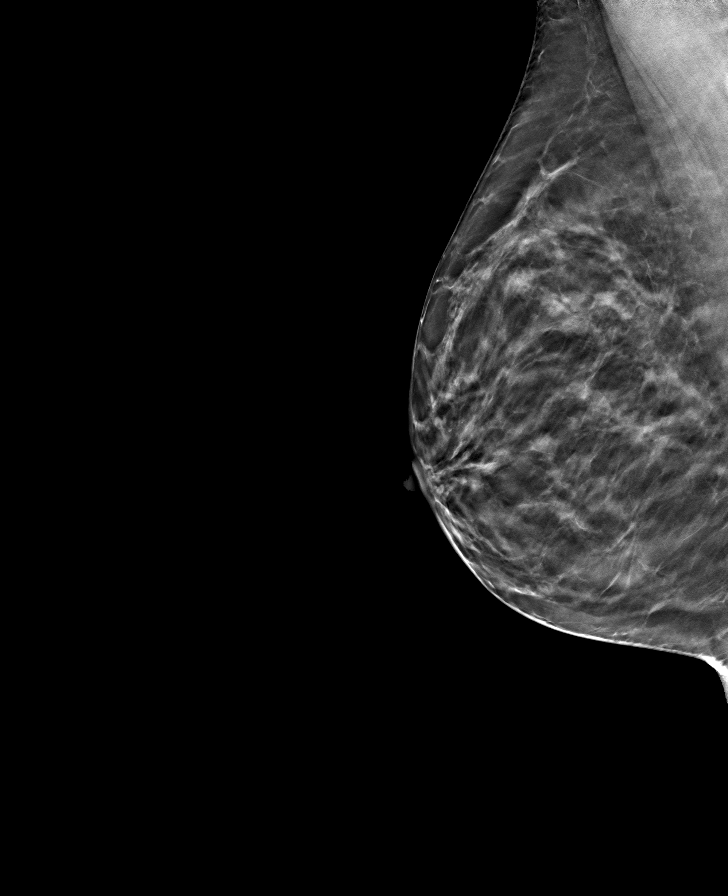

[R CC tomo · tomo slice 34/67.0]
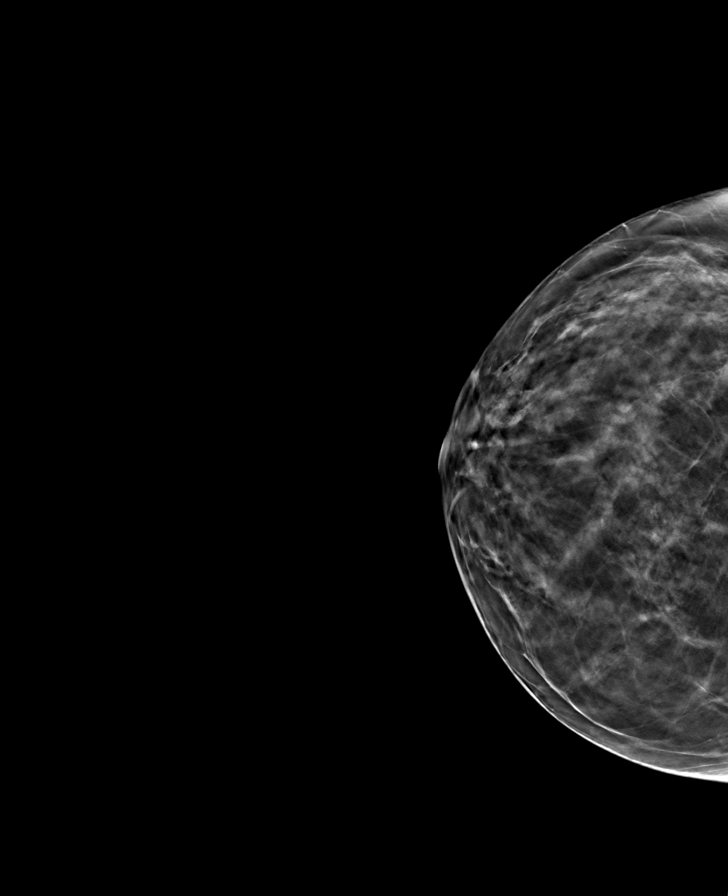

[L MLO tomo · tomo slice 34/67.0]
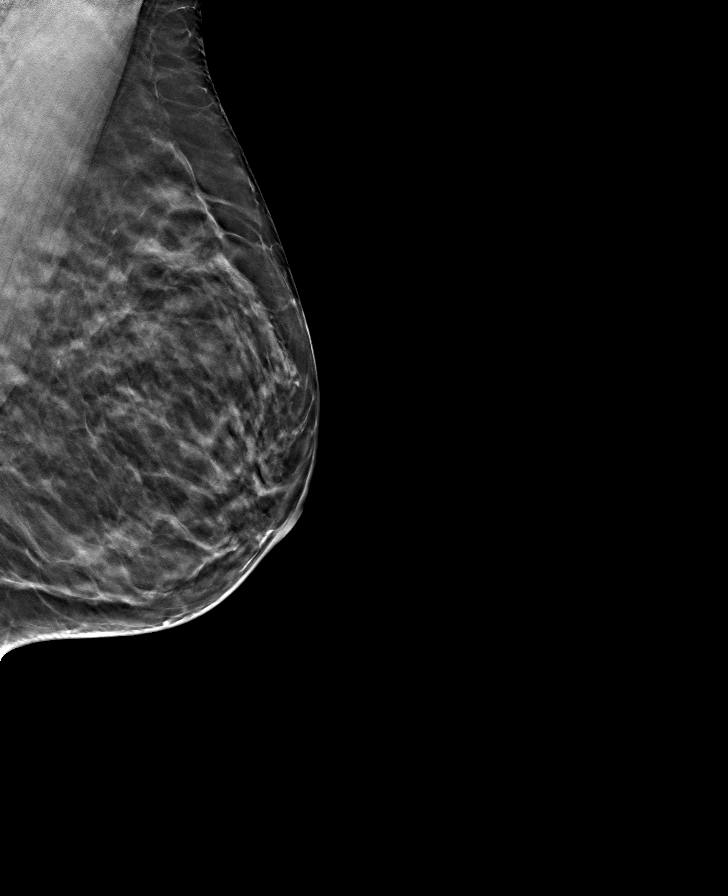

[L CC tomo · tomo slice 33/64.0]
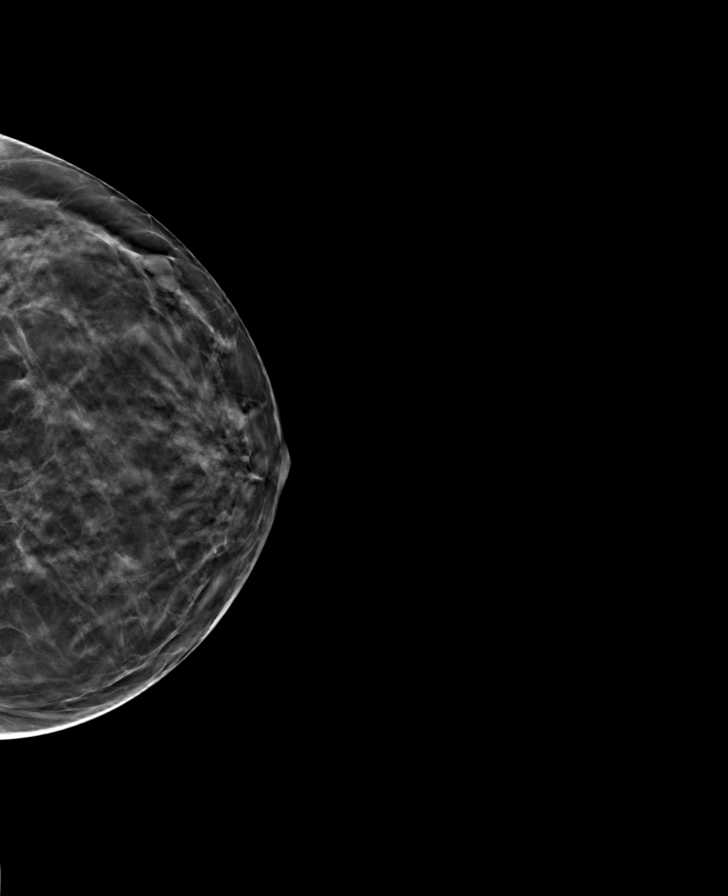

[8 of 24 positions shown; findings below may reference images not displayed]

ACR Breast Density Category c: The breast tissue is heterogeneously
dense, which may obscure small masses.
FINDINGS: There are no findings suspicious for malignancy.
IMPRESSION: No mammographic evidence of malignancy. A result letter of this
screening mammogram will be mailed directly to the patient.

RECOMMENDATION:
Screening mammogram in one year. (Code:Q3-W-BC3)

BI-RADS CATEGORY  1: Negative.

## 2021-09-06 ENCOUNTER — Other Ambulatory Visit: Payer: Self-pay | Admitting: Advanced Practice Midwife

## 2021-09-06 DIAGNOSIS — N926 Irregular menstruation, unspecified: Secondary | ICD-10-CM

## 2021-09-06 DIAGNOSIS — Z01419 Encounter for gynecological examination (general) (routine) without abnormal findings: Secondary | ICD-10-CM

## 2021-09-06 DIAGNOSIS — Z3041 Encounter for surveillance of contraceptive pills: Secondary | ICD-10-CM

## 2021-10-11 ENCOUNTER — Other Ambulatory Visit: Payer: Self-pay

## 2021-10-11 ENCOUNTER — Encounter: Payer: Self-pay | Admitting: Obstetrics and Gynecology

## 2021-10-11 ENCOUNTER — Telehealth: Payer: Self-pay

## 2021-10-11 ENCOUNTER — Ambulatory Visit (INDEPENDENT_AMBULATORY_CARE_PROVIDER_SITE_OTHER): Payer: BC Managed Care – PPO | Admitting: Obstetrics and Gynecology

## 2021-10-11 VITALS — BP 112/64 | Ht 67.0 in | Wt 153.0 lb

## 2021-10-11 DIAGNOSIS — Z01419 Encounter for gynecological examination (general) (routine) without abnormal findings: Secondary | ICD-10-CM

## 2021-10-11 DIAGNOSIS — Z1211 Encounter for screening for malignant neoplasm of colon: Secondary | ICD-10-CM

## 2021-10-11 DIAGNOSIS — Z13220 Encounter for screening for lipoid disorders: Secondary | ICD-10-CM

## 2021-10-11 DIAGNOSIS — Z0000 Encounter for general adult medical examination without abnormal findings: Secondary | ICD-10-CM

## 2021-10-11 DIAGNOSIS — I950 Idiopathic hypotension: Secondary | ICD-10-CM

## 2021-10-11 DIAGNOSIS — N920 Excessive and frequent menstruation with regular cycle: Secondary | ICD-10-CM

## 2021-10-11 DIAGNOSIS — Z3041 Encounter for surveillance of contraceptive pills: Secondary | ICD-10-CM

## 2021-10-11 DIAGNOSIS — Z1231 Encounter for screening mammogram for malignant neoplasm of breast: Secondary | ICD-10-CM

## 2021-10-11 DIAGNOSIS — D500 Iron deficiency anemia secondary to blood loss (chronic): Secondary | ICD-10-CM

## 2021-10-11 LAB — POCT HEMOGLOBIN: Hemoglobin: 9.6 g/dL — AB (ref 11–14.6)

## 2021-10-11 MED ORDER — LEVONORGESTREL-ETHINYL ESTRAD 0.1-20 MG-MCG PO TABS: 1.0000 | ORAL_TABLET | Freq: Every day | ORAL | 4 refills | Status: DC

## 2021-10-11 NOTE — Patient Instructions (Signed)
I value your feedback and you entrusting us with your care. If you get a [facility] patient survey, I would appreciate you taking the time to let us know about your experience today. Thank you!  [facility]: [phone]      

## 2021-10-11 NOTE — Telephone Encounter (Signed)
CALLED PATIENT NO ANSWER LEFT VOICEMAIL FOR A CALL BACK ? ?

## 2021-10-11 NOTE — Progress Notes (Signed)
? ?PCP: Cassandra Macias, No Pcp Per (Inactive) ? ? ?Chief Complaint  ?Cassandra Macias presents with  ? Gynecologic Exam  ?  Low BP at work yesterday, first time ever.  ? ? ?HPI: ?     Cassandra Macias is a 51 y.o. [id number] whose LMP was Cassandra Macias's last menstrual period was 10/04/2021 (exact date)., presents today for her annual examination.  Her menses are regular every 28-30 days, lasting 4-5 days, 2-3 heavy days, changing super plus tampons Q1 1/2-2 hrs.  Dysmenorrhea none. She does not have intermenstrual bleeding. S/p hyst, D&C with submucosal myomectomy with DR. Glennon Mac 6/22 due to hx of menorrhagia/AUB. This helped almost daily bleeding but still having menorrhagia. Not taking Fe supp.  ? ?Sex activity: single partner, contraception - OCP (estrogen/progesterone). She does not have vaginal dryness. ? ?Last Pap: 09/15/20 Results were: no abnormalities /neg HPV DNA.  ?No hx of abn paps.  ? ?Last mammogram: 01/13/21  Results were: normal--routine follow-up in 12 months ?There is a FH of breast cancer in her mother, genetic testing not indicated. There is no FH of ovarian cancer. The Cassandra Macias does do self-breast exams. ? ?Colonoscopy: never, willing to do it this yr ? ?Tobacco use: The Cassandra Macias denies current or previous tobacco use. ?Alcohol use: social drinker ?No drug use ?Exercise: moderately active ? ?She does get adequate calcium but not Vitamin D in her diet. ? ?No recent fasting labs. ? ?Pt with episode of feeling lightheaded, room spinning yesterday morning at work. Had low BP with school nurse. Also with vomiting, sweating; no tachycardia, SOB. Had had a few sips coffee, no food, as is her normal routine. Sx occurred spontaneously. Pt went home and slept, awoke feeling fine. No sx today.  ? ? ?Cassandra Macias Active Problem List  ? Diagnosis Date Noted  ? Menorrhagia with irregular cycle 01/11/2021  ? Intramural, submucous, and subserous leiomyoma of uterus 01/11/2021  ? ? ?Past Surgical History:  ?Procedure Laterality Date  ?  CESAREAN SECTION  2004, 2008  ? [facility]  ? COLPOSCOPY  2009  ? DILATATION & CURETTAGE/HYSTEROSCOPY WITH MYOSURE N/A 01/11/2021  ? Procedure: DILATATION & CURETTAGE/HYSTEROSCOPY WITH SUBMUCOSAL MYOMECTOMY;  Surgeon: Will Bonnet, MD;  Location: [facility] ORS;  Service: Gynecology;  Laterality: N/A;  ? HYSTEROSCOPY WITH D & C  2014  ? [facility])  ? INDUCED ABORTION    ? WISDOM TOOTH EXTRACTION    ? ? ?Family History  ?Problem Relation Age of Onset  ? Breast cancer Mother 75  ? COPD Father   ? Diabetes Father   ?     type 2  ? Hypertension Father   ? Diabetes type I Sister   ?     1/2 sister  ? Diabetes Brother   ?     type 2  ? ? ?Social History  ? ?Socioeconomic History  ? Marital status: Married  ?  Spouse name: Not on file  ? Number of children: 2  ? Years of education: 1  ? Highest education level: Not on file  ?Occupational History  ? Occupation: TEACHER  ?  Comment: [employer]  ?Tobacco Use  ? Smoking status: Never  ? Smokeless tobacco: Never  ?Vaping Use  ? Vaping Use: Never used  ?Substance and Sexual Activity  ? Alcohol use: Yes  ?  Comment: SOCIAL  ? Drug use: No  ? Sexual activity: Yes  ?  Birth control/protection: Pill  ?Other Topics Concern  ? Not on file  ?Social  History Narrative  ? Cassandra Macias lives with husband and 2 daughters. 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  ? ?Social Determinants of Health  ? ?Financial Resource Strain: Not on file  ?Food Insecurity: Not on file  ?Transportation Needs: Not on file  ?Physical Activity: Not on file  ?Stress: Not on file  ?Social Connections: Not on file  ?Intimate Partner Violence: Not on file  ? ? ? ?Current Outpatient Medications:  ?  ibuprofen (ADVIL) 600 MG tablet, Take 1 tablet (600 mg total) by mouth every 6 (six) hours as needed for moderate pain or mild pain., Disp: 30 tablet, Rfl: 0 ?  levonorgestrel-ethinyl estradiol (SRONYX) 0.1-20 MG-MCG tablet, Take 1 tablet by mouth daily. CONTINUOUS DOSING, Disp: 84  tablet, Rfl: 4 ? ? ? ? ?ROS: ? ?Review of Systems  ?Constitutional:  Negative for fatigue, fever and unexpected weight change.  ?Respiratory:  Negative for cough, shortness of breath and wheezing.   ?Cardiovascular:  Negative for chest pain, palpitations and leg swelling.  ?Gastrointestinal:  Negative for blood in stool, constipation, diarrhea, nausea and vomiting.  ?Endocrine: Negative for cold intolerance, heat intolerance and polyuria.  ?Genitourinary:  Negative for dyspareunia, dysuria, flank pain, frequency, genital sores, hematuria, menstrual problem, pelvic pain, urgency, vaginal bleeding, vaginal discharge and vaginal pain.  ?Musculoskeletal:  Negative for back pain, joint swelling and myalgias.  ?Skin:  Negative for rash.  ?Neurological:  Negative for dizziness, syncope, light-headedness, numbness and headaches.  ?Hematological:  Negative for adenopathy.  ?Psychiatric/Behavioral:  Negative for agitation, confusion, sleep disturbance and suicidal ideas. The Cassandra Macias is not nervous/anxious.   ?BREAST: No symptoms ? ? ? ?Objective: ?BP 112/64   Ht '5\' 7"'$  (1.702 m)   Wt 153 lb (69.4 kg)   LMP 10/04/2021 (Exact Date)   BMI 23.96 kg/m?  ? ? ?Physical Exam ?Constitutional:   ?   Appearance: She is well-developed.  ?Genitourinary:  ?   Vulva normal.  ?   Right Labia: No rash, tenderness or lesions. ?   Left Labia: No tenderness, lesions or rash. ?   Vaginal bleeding present.  ?   No vaginal discharge, erythema or tenderness.  ? ?   Right Adnexa: not tender and no mass present. ?   Left Adnexa: not tender and no mass present. ?   No cervical friability or polyp.  ?   Uterus is not enlarged or tender.  ?Breasts: ?   Right: No mass, nipple discharge, skin change or tenderness.  ?   Left: No mass, nipple discharge, skin change or tenderness.  ?Neck:  ?   Thyroid: No thyromegaly.  ?Cardiovascular:  ?   Rate and Rhythm: Normal rate and regular rhythm.  ?   Heart sounds: Normal heart sounds. No murmur heard. ?Pulmonary:   ?   Effort: Pulmonary effort is normal.  ?   Breath sounds: Normal breath sounds.  ?Abdominal:  ?   Palpations: Abdomen is soft.  ?   Tenderness: There is no abdominal tenderness. There is no guarding or rebound.  ?Musculoskeletal:     ?   General: Normal range of motion.  ?   Cervical back: Normal range of motion.  ?Lymphadenopathy:  ?   Cervical: No cervical adenopathy.  ?Neurological:  ?   General: No focal deficit present.  ?   Mental Status: She is alert and oriented to person, place, and time.  ?   Cranial Nerves: No cranial nerve deficit.  ?Skin: ?  General: Skin is warm and dry.  ?Psychiatric:     ?   Mood and Affect: Mood normal.     ?   Behavior: Behavior normal.     ?   Thought Content: Thought content normal.     ?   Judgment: Judgment normal.  ?Vitals reviewed.  ? ? ?Results: ?Results for orders placed or performed in visit on 10/11/21 (from the past 24 hour(s))  ?POCT hemoglobin     Status: Abnormal  ? Collection Time: 10/11/21  3:39 PM  ?Result Value Ref Range  ? Hemoglobin 9.6 (A) 11 - 14.6 g/dL  ? ? ?Assessment/Plan: ? ?Encounter for annual routine gynecological examination - Plan: POCT hemoglobin ? ?Encounter for surveillance of contraceptive pills - Plan: levonorgestrel-ethinyl estradiol (SRONYX) 0.1-20 MG-MCG tablet; OCP RF. Try cont dosing due to menorrhagia and IDA.  ? ?Encounter for screening mammogram for malignant neoplasm of breast - Plan: MM 3D SCREEN BREAST BILATERAL; pt to schedule mammo ? ?Screening for colon cancer - Plan: Ambulatory referral to Gastroenterology; refer to GI for scr colonoscopy due to age ? ?Menorrhagia with regular cycle - Plan: CBC with Differential/Platelet, Iron, TIBC and Ferritin Panel, Iron, TIBC and Ferritin Panel, CBC with Differential/Platelet, levonorgestrel-ethinyl estradiol (SRONYX) 0.1-20 MG-MCG tablet; anemic today. Check labs. Do cont dosing OCPs, start OTC Slow FE QD. Will f/u with results.  ? ?Iron deficiency anemia due to chronic blood loss - Plan:  CBC with Differential/Platelet, Iron, TIBC and Ferritin Panel, Iron, TIBC and Ferritin Panel, CBC with Differential/Platelet ? ?Blood tests for routine general physical examination - Plan: Comprehensive m

## 2021-10-13 ENCOUNTER — Telehealth: Payer: Self-pay

## 2021-10-13 NOTE — Telephone Encounter (Signed)
CALLED PATIENT NO ANSWER LEFT VOICEMAIL FOR A CALL BACK  letter sent 

## 2022-01-23 ENCOUNTER — Ambulatory Visit
Admission: RE | Admit: 2022-01-23 | Discharge: 2022-01-23 | Disposition: A | Discharge status: Home / Self Care | Payer: BC Managed Care – PPO | Source: Ambulatory Visit | Attending: Obstetrics and Gynecology | Admitting: Obstetrics and Gynecology

## 2022-01-23 DIAGNOSIS — Z1231 Encounter for screening mammogram for malignant neoplasm of breast: Secondary | ICD-10-CM | POA: Diagnosis present

## 2022-11-20 ENCOUNTER — Other Ambulatory Visit: Payer: Self-pay | Admitting: Obstetrics and Gynecology

## 2022-11-20 DIAGNOSIS — Z3041 Encounter for surveillance of contraceptive pills: Secondary | ICD-10-CM

## 2022-11-20 DIAGNOSIS — N920 Excessive and frequent menstruation with regular cycle: Secondary | ICD-10-CM

## 2023-01-09 ENCOUNTER — Ambulatory Visit (INDEPENDENT_AMBULATORY_CARE_PROVIDER_SITE_OTHER): Payer: BC Managed Care – PPO | Admitting: Obstetrics and Gynecology

## 2023-01-09 ENCOUNTER — Encounter: Payer: Self-pay | Admitting: Obstetrics and Gynecology

## 2023-01-09 VITALS — BP 102/70 | Ht 67.0 in | Wt 158.0 lb

## 2023-01-09 DIAGNOSIS — Z1211 Encounter for screening for malignant neoplasm of colon: Secondary | ICD-10-CM

## 2023-01-09 DIAGNOSIS — Z3041 Encounter for surveillance of contraceptive pills: Secondary | ICD-10-CM

## 2023-01-09 DIAGNOSIS — N920 Excessive and frequent menstruation with regular cycle: Secondary | ICD-10-CM

## 2023-01-09 DIAGNOSIS — Z1231 Encounter for screening mammogram for malignant neoplasm of breast: Secondary | ICD-10-CM

## 2023-01-09 DIAGNOSIS — Z01419 Encounter for gynecological examination (general) (routine) without abnormal findings: Secondary | ICD-10-CM

## 2023-01-09 MED ORDER — LEVONORGESTREL-ETHINYL ESTRAD 0.1-20 MG-MCG PO TABS: ORAL_TABLET | ORAL | 4 refills | Status: DC

## 2023-01-09 NOTE — Progress Notes (Signed)
PCP: Patient, No Pcp Per   Chief Complaint  Patient presents with   Gynecologic Exam    No concerns    HPI:      [name] is a 52 y.o. [id number] whose LMP was Patient's last menstrual period was 01/02/2023 (exact date)., presents today for her annual examination.  Her menses are regular every 2-3 months with cont dosing OCPs due to hx of menorrhagia, lasting 4-7 days, either light or heavy, changing super plus tampons Q1 1/2-2 hrs on heavy days.  Dysmenorrhea mild on heavier cycles. She does not have intermenstrual bleeding. S/p hyst, D&C with submucosal myomectomy with DR. Jean Rosenthal 6/22 due to hx of menorrhagia/AUB. Did not help heavy flow, but cont dosing OCPs improved sx for pt. Not taking Fe supp. She does have vasomotor sx.   Sex activity: single partner, contraception - OCP (estrogen/progesterone). She does have vaginal dryness, hasn't tried lubricants.  Last Pap: 09/15/20 Results were: no abnormalities /neg HPV DNA.  No hx of abn paps.   Last mammogram: 01/23/22  Results were: normal--routine follow-up in 12 months There is a FH of breast cancer in her mother, genetic testing not indicated. There is no FH of ovarian cancer. The patient does do self-breast exams.  Colonoscopy: never, willing to do it this yr  Tobacco use: The patient denies current or previous tobacco use. Alcohol use: social drinker No drug use Exercise: moderately active  She does get adequate calcium and some Vitamin D in her diet. 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    Patient Active Problem List   Diagnosis Date Noted   Menorrhagia with irregular cycle 01/11/2021   Intramural, submucous, and subserous leiomyoma of uterus 01/11/2021    Past Surgical History:  Procedure Laterality Date   CESAREAN SECTION  2004, 2008   [facility]   COLPOSCOPY  2009   DILATATION & CURETTAGE/HYSTEROSCOPY WITH MYOSURE N/A 01/11/2021   Procedure: DILATATION & CURETTAGE/HYSTEROSCOPY WITH  SUBMUCOSAL MYOMECTOMY;  Surgeon: Conard Novak, MD;  Location: [facility] ORS;  Service: Gynecology;  Laterality: N/A;   HYSTEROSCOPY WITH D & C  2014   [place] ([facility])   INDUCED ABORTION     WISDOM TOOTH EXTRACTION      Family History  Problem Relation Age of Onset   Breast cancer Mother 70   COPD Father    Diabetes Father        type 2   Hypertension Father    Diabetes type I Sister        1/2 sister   Diabetes Brother        type 2    Social History   Socioeconomic History   Marital status: Married    Spouse name: Not on file   Number of children: 2   Years of education: 18   Highest education level: Not on file  Occupational History   Occupation: TEACHER    Comment: [employer]  Tobacco Use   Smoking status: Never   Smokeless tobacco: Never  Vaping Use   Vaping Use: Never used  Substance and Sexual Activity   Alcohol use: Yes    Comment: SOCIAL   Drug use: No   Sexual activity: Yes    Birth control/protection: Pill  Other Topics Concern   Not on file  Social History Narrative   Patient lives with husband and 2 daughters. 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  Social Determinants of Health   Financial Resource Strain: Not on file  Food Insecurity: Not on file  Transportation Needs: Not on file  Physical Activity: Not on file  Stress: Not on file  Social Connections: Not on file  Intimate Partner Violence: Not on file     Current Outpatient Medications:    levonorgestrel-ethinyl estradiol (SRONYX) 0.1-20 MG-MCG tablet, TAKE 1 TABLET BY MOUTH DAILY. CONTINUOUS DOSING, Disp: 84 tablet, Rfl: 4     ROS:  Review of Systems  Constitutional:  Negative for fatigue, fever and unexpected weight change.  Respiratory:  Negative for cough, shortness of breath and wheezing.   Cardiovascular:  Negative for chest pain, palpitations and leg swelling.  Gastrointestinal:  Negative for blood in stool, constipation,  diarrhea, nausea and vomiting.  Endocrine: Negative for cold intolerance, heat intolerance and polyuria.  Genitourinary:  Negative for dyspareunia, dysuria, flank pain, frequency, genital sores, hematuria, menstrual problem, pelvic pain, urgency, vaginal bleeding, vaginal discharge and vaginal pain.  Musculoskeletal:  Negative for back pain, joint swelling and myalgias.  Skin:  Negative for rash.  Neurological:  Negative for dizziness, syncope, light-headedness, numbness and headaches.  Hematological:  Negative for adenopathy.  Psychiatric/Behavioral:  Negative for agitation, confusion, sleep disturbance and suicidal ideas. The patient is not nervous/anxious.    BREAST: No symptoms    Objective: BP 102/70   Ht 5\' 7"  (1.702 m)   Wt 158 lb (71.7 kg)   LMP 01/02/2023 (Exact Date)   BMI 24.75 kg/m    Physical Exam Constitutional:      Appearance: She is well-developed.  Genitourinary:     Vulva normal.     Right Labia: No rash, tenderness or lesions.    Left Labia: No tenderness, lesions or rash.    Vaginal bleeding present.     No vaginal discharge, erythema or tenderness.      Right Adnexa: not tender and no mass present.    Left Adnexa: not tender and no mass present.    No cervical friability or polyp.     Uterus is not enlarged or tender.  Breasts:    Right: No mass, nipple discharge, skin change or tenderness.     Left: No mass, nipple discharge, skin change or tenderness.  Neck:     Thyroid: No thyromegaly.  Cardiovascular:     Rate and Rhythm: Normal rate and regular rhythm.     Heart sounds: Normal heart sounds. No murmur heard. Pulmonary:     Effort: Pulmonary effort is normal.     Breath sounds: Normal breath sounds.  Abdominal:     Palpations: Abdomen is soft.     Tenderness: There is no abdominal tenderness. There is no guarding or rebound.  Musculoskeletal:        General: Normal range of motion.     Cervical back: Normal range of motion.   Lymphadenopathy:     Cervical: No cervical adenopathy.  Neurological:     General: No focal deficit present.     Mental Status: She is alert and oriented to person, place, and time.     Cranial Nerves: No cranial nerve deficit.  Skin:    General: Skin is warm and dry.  Psychiatric:        Mood and Affect: Mood normal.        Behavior: Behavior normal.        Thought Content: Thought content normal.        Judgment: Judgment normal.  Vitals reviewed.  Assessment/Plan:  Encounter for annual routine gynecological examination  Encounter for surveillance of contraceptive pills - Plan: levonorgestrel-ethinyl estradiol (SRONYX) 0.1-20 MG-MCG tablet; OCP RF.   Encounter for screening mammogram for malignant neoplasm of breast - Plan: MM 3D SCREENING MAMMOGRAM BILATERAL BREAST; pt to schedule mammo  Screening for colon cancer - Plan: Ambulatory referral to Gastroenterology; refer to GI for scr colonoscopy  Menorrhagia with regular cycle - Plan: CBC with Differential/Platelet, levonorgestrel-ethinyl estradiol (SRONYX) 0.1-20 MG-MCG tablet; sx improved with cont dosing OCPs. Check CBC given hx of sx. Not taking Fe supp  Meds ordered this encounter  Medications   levonorgestrel-ethinyl estradiol (SRONYX) 0.1-20 MG-MCG tablet    Sig: TAKE 1 TABLET BY MOUTH DAILY. CONTINUOUS DOSING    Dispense:  84 tablet    Refill:  4    Order Specific Question:   Supervising Provider    Answer:   Waymon Budge            GYN counsel breast self exam, mammography screening, adequate intake of calcium and vitamin D, diet and exercise    F/U  Return in about 1 year (around 01/09/2024).  Cassandra Umar B. Sreya Froio, PA-C 01/09/2023 1:36 PM

## 2023-01-09 NOTE — Patient Instructions (Signed)
I value your feedback and you entrusting us with your care. If you get a [facility] patient survey, I would appreciate you taking the time to let us know about your experience today. Thank you!  [facility] ([place])--[phone]  

## 2023-01-10 ENCOUNTER — Other Ambulatory Visit: Payer: Self-pay

## 2023-01-10 ENCOUNTER — Telehealth: Payer: Self-pay

## 2023-01-10 DIAGNOSIS — Z1211 Encounter for screening for malignant neoplasm of colon: Secondary | ICD-10-CM

## 2023-01-10 LAB — CBC WITH DIFFERENTIAL/PLATELET
Basophils Absolute: 0.1 10*3/uL (ref 0.0–0.2)
Basos: 1 %
EOS (ABSOLUTE): 0.1 10*3/uL (ref 0.0–0.4)
Eos: 1 %
Hematocrit: 36.8 % (ref 34.0–46.6)
Hemoglobin: 12 g/dL (ref 11.1–15.9)
Immature Grans (Abs): 0 10*3/uL (ref 0.0–0.1)
Immature Granulocytes: 0 %
Lymphocytes Absolute: 2.6 10*3/uL (ref 0.7–3.1)
Lymphs: 36 %
MCH: 28.1 pg (ref 26.6–33.0)
MCHC: 32.6 g/dL (ref 31.5–35.7)
MCV: 86 fL (ref 79–97)
Monocytes Absolute: 0.5 10*3/uL (ref 0.1–0.9)
Monocytes: 7 %
Neutrophils Absolute: 3.9 10*3/uL (ref 1.4–7.0)
Neutrophils: 55 %
Platelets: 257 10*3/uL (ref 150–450)
RBC: 4.27 x10E6/uL (ref 3.77–5.28)
RDW: 13.7 % (ref 11.7–15.4)
WBC: 7.1 10*3/uL (ref 3.4–10.8)

## 2023-01-10 MED ORDER — NA SULFATE-K SULFATE-MG SULF 17.5-3.13-1.6 GM/177ML PO SOLN: 1.0000 | Freq: Once | ORAL | 0 refills | Status: AC

## 2023-01-10 NOTE — Telephone Encounter (Signed)
Gastroenterology Pre-Procedure Review  Request Date: 03/12/23 Requesting Physician: Dr. Servando Snare  PATIENT REVIEW QUESTIONS: The patient responded to the following health history questions as indicated:    1. Are you having any GI issues? no 2. Do you have a personal history of Polyps? no 3. Do you have a family history of Colon Cancer or Polyps? no 4. Diabetes Mellitus? no 5. Joint replacements in the past 12 months?no 6. Major health problems in the past 3 months?no 7. Any artificial heart valves, MVP, or defibrillator?no    MEDICATIONS & ALLERGIES:    Patient reports the following regarding taking any anticoagulation/antiplatelet therapy:   Plavix, Coumadin, Eliquis, Xarelto, Lovenox, Pradaxa, Brilinta, or Effient? no Aspirin? no  Patient confirms/reports the following medications:  Current Outpatient Medications  Medication Sig Dispense Refill   levonorgestrel-ethinyl estradiol (SRONYX) 0.1-20 MG-MCG tablet TAKE 1 TABLET BY MOUTH DAILY. CONTINUOUS DOSING 84 tablet 4   No current facility-administered medications for this visit.    Patient confirms/reports the following allergies:  No Known Allergies  No orders of the defined types were placed in this encounter.   AUTHORIZATION INFORMATION Primary Insurance: 1D#: Group #:  Secondary Insurance: 1D#: Group #:  SCHEDULE INFORMATION: Date: 03/12/23 Time: Location: [facility]

## 2023-02-28 ENCOUNTER — Encounter: Payer: Self-pay | Admitting: Gastroenterology

## 2023-03-05 ENCOUNTER — Other Ambulatory Visit: Payer: Self-pay

## 2023-03-12 ENCOUNTER — Other Ambulatory Visit: Payer: Self-pay

## 2023-03-12 ENCOUNTER — Ambulatory Visit
Admission: RE | Admit: 2023-03-12 | Discharge: 2023-03-12 | Disposition: A | Discharge status: Home / Self Care | Payer: BC Managed Care – PPO | Source: Home / Self Care | Attending: Gastroenterology | Admitting: Gastroenterology

## 2023-03-12 ENCOUNTER — Ambulatory Visit: Payer: BC Managed Care – PPO | Admitting: Anesthesiology

## 2023-03-12 ENCOUNTER — Encounter: Payer: Self-pay | Admitting: Gastroenterology

## 2023-03-12 ENCOUNTER — Encounter
Admission: RE | Disposition: A | Discharge status: Home / Self Care | Payer: Self-pay | Source: Home / Self Care | Attending: Gastroenterology

## 2023-03-12 DIAGNOSIS — K635 Polyp of colon: Secondary | ICD-10-CM

## 2023-03-12 DIAGNOSIS — Z793 Long term (current) use of hormonal contraceptives: Secondary | ICD-10-CM | POA: Insufficient documentation

## 2023-03-12 DIAGNOSIS — K640 First degree hemorrhoids: Secondary | ICD-10-CM | POA: Diagnosis not present

## 2023-03-12 DIAGNOSIS — D125 Benign neoplasm of sigmoid colon: Secondary | ICD-10-CM | POA: Insufficient documentation

## 2023-03-12 DIAGNOSIS — Z1211 Encounter for screening for malignant neoplasm of colon: Secondary | ICD-10-CM | POA: Diagnosis present

## 2023-03-12 HISTORY — PX: POLYPECTOMY: SHX5525

## 2023-03-12 HISTORY — PX: COLONOSCOPY WITH PROPOFOL: SHX5780

## 2023-03-12 LAB — POCT PREGNANCY, URINE: Preg Test, Ur: NEGATIVE

## 2023-03-12 SURGERY — COLONOSCOPY WITH PROPOFOL
Anesthesia: General | Site: Rectum

## 2023-03-12 MED ORDER — LACTATED RINGERS IV SOLN: INTRAVENOUS | Status: DC

## 2023-03-12 MED ORDER — SODIUM CHLORIDE 0.9 % IV SOLN: INTRAVENOUS | Status: DC

## 2023-03-12 MED ORDER — PROPOFOL 10 MG/ML IV BOLUS
INTRAVENOUS | Status: DC | PRN
Start: 2023-03-12 — End: 2023-03-12
  Administered 2023-03-12 (×2): 20 mg via INTRAVENOUS
  Administered 2023-03-12: 30 mg via INTRAVENOUS
  Administered 2023-03-12: 80 mg via INTRAVENOUS
  Administered 2023-03-12 (×2): 30 mg via INTRAVENOUS
  Administered 2023-03-12: 20 mg via INTRAVENOUS

## 2023-03-12 MED ORDER — LIDOCAINE HCL (CARDIAC) PF 100 MG/5ML IV SOSY
PREFILLED_SYRINGE | INTRAVENOUS | Status: DC | PRN
  Administered 2023-03-12: 100 mg via INTRAVENOUS

## 2023-03-12 MED ORDER — STERILE WATER FOR IRRIGATION IR SOLN
Status: DC | PRN
  Administered 2023-03-12: 1

## 2023-03-12 SURGICAL SUPPLY — 21 items

## 2023-03-12 NOTE — Transfer of Care (Signed)
Immediate Anesthesia Transfer of Care Note  Patient: Cassandra Macias  Procedure(s) Performed: COLONOSCOPY WITH  BIOPSY (Rectum)  Patient Location: PACU  Anesthesia Type: General  Level of Consciousness: awake, alert  and patient cooperative  Airway and Oxygen Therapy: Patient Spontanous Breathing and Patient connected to supplemental oxygen  Post-op Assessment: Post-op Vital signs reviewed, Patient's Cardiovascular Status Stable, Respiratory Function Stable, Patent Airway and No signs of Nausea or vomiting  Post-op Vital Signs: Reviewed and stable  Complications: No notable events documented.

## 2023-03-12 NOTE — Op Note (Signed)
[facility] Gastroenterology Patient Name: Cassandra Macias Procedure Date: 03/12/2023 8:21 AM MRN: [id number] Account #: [id number] Date of Birth: [birth year] Admit Type: Outpatient Age: 52 Room: [facility] OR ROOM 01 Gender: Female Note Status: Finalized Instrument Name: [id number] Procedure:             Colonoscopy Indications:           Screening for colorectal malignant neoplasm Providers:             Midge Minium MD, MD Medicines:             Propofol per Anesthesia Complications:         No immediate complications. Procedure:             Pre-Anesthesia Assessment:                        - Prior to the procedure, a History and Physical was                         performed, and patient medications and allergies were                         reviewed. The patient's tolerance of previous                         anesthesia was also reviewed. The risks and benefits                         of the procedure and the sedation options and risks                         were discussed with the patient. All questions were                         answered, and informed consent was obtained. Prior                         Anticoagulants: The patient has taken no anticoagulant                         or antiplatelet agents. ASA Grade Assessment: II - A                         patient with mild systemic disease. After reviewing                         the risks and benefits, the patient was deemed in                         satisfactory condition to undergo the procedure.                        After obtaining informed consent, the colonoscope was                         passed under direct vision. Throughout the procedure,                         the patient's blood pressure,  pulse, and oxygen                         saturations were monitored continuously. The                         Colonoscope was introduced through the anus and                         advanced to the the cecum,  identified by appendiceal                         orifice and ileocecal valve. The colonoscopy was                         performed without difficulty. The patient tolerated                         the procedure well. The quality of the bowel                         preparation was excellent. Findings:      The perianal and digital rectal examinations were normal.      A 5 mm polyp was found in the sigmoid colon. The polyp was pedunculated.       The polyp was removed with a cold snare. Resection and retrieval were       complete.      Non-bleeding internal hemorrhoids were found during retroflexion. The       hemorrhoids were Grade I (internal hemorrhoids that do not prolapse). Impression:            - One 5 mm polyp in the sigmoid colon, removed with a                         cold snare. Resected and retrieved.                        - Non-bleeding internal hemorrhoids. Recommendation:        - Discharge patient to home.                        - Resume previous diet.                        - Continue present medications.                        - Await pathology results.                        - If the pathology report reveals adenomatous tissue,                         then repeat the colonoscopy for surveillance in 7                         years. Procedure Code(s):     --- Professional ---                        [phone], Colonoscopy, flexible;  with removal of                         tumor(s), polyp(s), or other lesion(s) by snare                         technique Diagnosis Code(s):     --- Professional ---                        Z12.11, Encounter for screening for malignant neoplasm                         of colon                        D12.5, Benign neoplasm of sigmoid colon CPT copyright 2022 American Medical Association. All rights reserved. The codes documented in this report are preliminary and upon coder review may  be revised to meet current compliance requirements. Midge Minium MD, MD 03/12/2023 8:47:21 AM This report has been signed electronically. Number of Addenda: 0 Note Initiated On: 03/12/2023 8:21 AM Scope Withdrawal Time: 0 hours 8 minutes 34 seconds  Total Procedure Duration: 0 hours 14 minutes 0 seconds  Estimated Blood Loss:  Estimated blood loss: none.      [facility]

## 2023-03-12 NOTE — H&P (Signed)
Cassandra Minium, MD [facility] [address], [address] [place], [place] [address] Phone: [phone] Fax : [phone]  Primary Care Physician:  Patient, No Pcp Per Primary Gastroenterologist:  Dr. Servando Snare  Pre-Procedure History & Physical: HPI:  Cassandra Macias is a 52 y.o. female is here for a screening colonoscopy.   Past Medical History:  Diagnosis Date   History of abnormal cervical Pap smear     Past Surgical History:  Procedure Laterality Date   CESAREAN SECTION  2004, 2008   [facility]   COLPOSCOPY  2009   DILATATION & CURETTAGE/HYSTEROSCOPY WITH MYOSURE N/A 01/11/2021   Procedure: DILATATION & CURETTAGE/HYSTEROSCOPY WITH SUBMUCOSAL MYOMECTOMY;  Surgeon: Conard Novak, MD;  Location: [facility] ORS;  Service: Gynecology;  Laterality: N/A;   HYSTEROSCOPY WITH D & C  2014   [facility] ([facility])   INDUCED ABORTION     WISDOM TOOTH EXTRACTION      Prior to Admission medications   Medication Sig Start Date End Date Taking? Authorizing Provider  levonorgestrel-ethinyl estradiol (SRONYX) 0.1-20 MG-MCG tablet TAKE 1 TABLET BY MOUTH DAILY. CONTINUOUS DOSING 01/09/23  Yes Copland, Alicia B, PA-C    Allergies as of 01/10/2023   (No Known Allergies)    Family History  Problem Relation Age of Onset   Breast cancer Mother 3   COPD Father    Diabetes Father        type 2   Hypertension Father    Diabetes type I Sister        1/2 sister   Diabetes Brother        type 2    Social History   Socioeconomic History   Marital status: Married    Spouse name: Not on file   Number of children: 2   Years of education: 18   Highest education level: Not on file  Occupational History   Occupation: TEACHER    Comment: [employer]  Tobacco Use   Smoking status: Never   Smokeless tobacco: Never  Vaping Use   Vaping status: Never Used  Substance and Sexual Activity   Alcohol use: Yes    Comment: SOCIAL   Drug use: No   Sexual activity: Yes    Birth control/protection:  Pill  Other Topics Concern   Not on file  Social History Narrative   Patient lives with husband and 2 daughters. 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   Social Determinants of Health   Financial Resource Strain: Not on file  Food Insecurity: Not on file  Transportation Needs: Not on file  Physical Activity: Not on file  Stress: Not on file  Social Connections: Not on file  Intimate Partner Violence: Not on file    Review of Systems: See HPI, otherwise negative ROS  Physical Exam: BP 112/71   Temp 99.6 F (37.6 C) (Temporal)   Resp 17   Ht 5' 7.01" (1.702 m)   Wt 71.1 kg   SpO2 97%   BMI 24.55 kg/m  General:   Alert,  pleasant and cooperative in NAD Head:  Normocephalic and atraumatic. Neck:  Supple; no masses or thyromegaly. Lungs:  Clear throughout to auscultation.    Heart:  Regular rate and rhythm. Abdomen:  Soft, nontender and nondistended. Normal bowel sounds, without guarding, and without rebound.   Neurologic:  Alert and  oriented x4;  grossly normal neurologically.  Impression/Plan: Cassandra Macias is now here to undergo a screening colonoscopy.  Risks,  benefits, and alternatives regarding colonoscopy have been reviewed with the patient.  Questions have been answered.  All parties agreeable.

## 2023-03-12 NOTE — Anesthesia Preprocedure Evaluation (Addendum)
Anesthesia Evaluation  Patient identified by MRN, date of birth, ID band Patient awake    Reviewed: Allergy & Precautions, H&P , NPO status , Patient's Chart, lab work & pertinent test results  Airway Mallampati: II  TM Distance: >3 FB Neck ROM: Full    Dental no notable dental hx.  Has one crown right upper molar region, uncertain exactly which tooth:   Pulmonary neg pulmonary ROS   Pulmonary exam normal breath sounds clear to auscultation       Cardiovascular negative cardio ROS Normal cardiovascular exam Rhythm:Regular Rate:Normal     Neuro/Psych negative neurological ROS  negative psych ROS   GI/Hepatic negative GI ROS, Neg liver ROS,,,  Endo/Other  negative endocrine ROS    Renal/GU negative Renal ROS  negative genitourinary   Musculoskeletal negative musculoskeletal ROS (+)    Abdominal   Peds negative pediatric ROS (+)  Hematology negative hematology ROS (+)   Anesthesia Other Findings   Reproductive/Obstetrics negative OB ROS                             Anesthesia Physical Anesthesia Plan  ASA: 1  Anesthesia Plan: General   Post-op Pain Management:    Induction: Intravenous  PONV Risk Score and Plan:   Airway Management Planned: Natural Airway and Nasal Cannula  Additional Equipment:   Intra-op Plan:   Post-operative Plan:   Informed Consent: I have reviewed the patients History and Physical, chart, labs and discussed the procedure including the risks, benefits and alternatives for the proposed anesthesia with the patient or authorized representative who has indicated his/her understanding and acceptance.     Dental Advisory Given  Plan Discussed with: Anesthesiologist, CRNA and Surgeon  Anesthesia Plan Comments: (Patient consented for risks of anesthesia including but not limited to:  - adverse reactions to medications - risk of airway placement if  required - damage to eyes, teeth, lips or other oral mucosa - nerve damage due to positioning  - sore throat or hoarseness - Damage to heart, brain, nerves, lungs, other parts of body or loss of life  Patient voiced understanding.)        Anesthesia Quick Evaluation

## 2023-03-12 NOTE — Anesthesia Postprocedure Evaluation (Signed)
Anesthesia Post Note  Patient: Cassandra Macias  Procedure(s) Performed: COLONOSCOPY WITH  BIOPSY (Rectum) POLYPECTOMY  Patient location during evaluation: PACU Anesthesia Type: General Level of consciousness: awake and alert Pain management: pain level controlled Vital Signs Assessment: post-procedure vital signs reviewed and stable Respiratory status: spontaneous breathing, nonlabored ventilation, respiratory function stable and patient connected to nasal cannula oxygen Cardiovascular status: blood pressure returned to baseline and stable Postop Assessment: no apparent nausea or vomiting Anesthetic complications: no   No notable events documented.   Last Vitals:  Vitals:   03/12/23 0848 03/12/23 0857  BP: 113/71 101/73  Pulse: 67 63  Resp: (!) 25 14  Temp: 36.4 C 36.4 C  SpO2: 100% 100%    Last Pain:  Vitals:   03/12/23 0857  TempSrc:   PainSc: 0-No pain                 Marisue Humble

## 2023-03-13 ENCOUNTER — Encounter: Payer: Self-pay | Admitting: Gastroenterology

## 2023-03-15 ENCOUNTER — Encounter: Payer: Self-pay | Admitting: Gastroenterology

## 2023-03-29 ENCOUNTER — Ambulatory Visit
Admission: RE | Admit: 2023-03-29 | Discharge: 2023-03-29 | Disposition: A | Discharge status: Home / Self Care | Payer: BC Managed Care – PPO | Source: Ambulatory Visit | Attending: Obstetrics and Gynecology | Admitting: Obstetrics and Gynecology

## 2023-03-29 DIAGNOSIS — Z1231 Encounter for screening mammogram for malignant neoplasm of breast: Secondary | ICD-10-CM | POA: Insufficient documentation

## 2023-10-10 ENCOUNTER — Other Ambulatory Visit: Payer: Self-pay | Admitting: Obstetrics and Gynecology

## 2023-10-10 DIAGNOSIS — N920 Excessive and frequent menstruation with regular cycle: Secondary | ICD-10-CM

## 2023-10-10 DIAGNOSIS — Z3041 Encounter for surveillance of contraceptive pills: Secondary | ICD-10-CM

## 2023-12-21 ENCOUNTER — Other Ambulatory Visit: Payer: Self-pay

## 2023-12-21 ENCOUNTER — Other Ambulatory Visit: Payer: Self-pay | Admitting: Obstetrics and Gynecology

## 2023-12-21 DIAGNOSIS — N920 Excessive and frequent menstruation with regular cycle: Secondary | ICD-10-CM

## 2023-12-21 DIAGNOSIS — Z3041 Encounter for surveillance of contraceptive pills: Secondary | ICD-10-CM

## 2023-12-21 MED ORDER — LEVONORGESTREL-ETHINYL ESTRAD 0.1-20 MG-MCG PO TABS
ORAL_TABLET | ORAL | 0 refills | Status: DC
Start: 2023-12-21 — End: 2024-03-13

## 2024-02-22 ENCOUNTER — Other Ambulatory Visit: Payer: Self-pay | Admitting: Obstetrics and Gynecology

## 2024-02-22 DIAGNOSIS — N920 Excessive and frequent menstruation with regular cycle: Secondary | ICD-10-CM

## 2024-02-22 DIAGNOSIS — Z3041 Encounter for surveillance of contraceptive pills: Secondary | ICD-10-CM

## 2024-02-24 ENCOUNTER — Other Ambulatory Visit: Payer: Self-pay | Admitting: Obstetrics and Gynecology

## 2024-02-24 DIAGNOSIS — N920 Excessive and frequent menstruation with regular cycle: Secondary | ICD-10-CM

## 2024-02-24 DIAGNOSIS — Z3041 Encounter for surveillance of contraceptive pills: Secondary | ICD-10-CM

## 2024-03-03 NOTE — Progress Notes (Unsigned)
 PCP: Patient, No Pcp Per   No chief complaint on file.   HPI:      [name] is a 53 y.o. [id number] whose LMP was No LMP recorded., presents today for her annual examination.  Her menses are regular every 2-3 months with cont dosing OCPs due to hx of menorrhagia, lasting 4-7 days, either light or heavy, changing super plus tampons Q1 1/2-2 hrs on heavy days.  Dysmenorrhea mild on heavier cycles. She does not have intermenstrual bleeding. S/p hyst, D&C with submucosal myomectomy with DR. Leonce 6/22 due to hx of menorrhagia/AUB. Did not help heavy flow, but cont dosing OCPs improved sx for pt. Not taking Fe supp. She does have vasomotor sx.   Sex activity: single partner, contraception - OCP (estrogen/progesterone). She does have vaginal dryness, hasn't tried lubricants.  Last Pap: 09/15/20 Results were: no abnormalities /neg HPV DNA.  No hx of abn paps.   Last mammogram: 03/29/23  Results were: normal--routine follow-up in 12 months There is a FH of breast cancer in her mother, genetic testing not indicated. There is no FH of ovarian cancer. The patient does do self-breast exams.  Colonoscopy: 8/24 at [facility] with polyps; repeat due after 7 yrs  Tobacco use: The patient denies current or previous tobacco use. Alcohol use: social drinker No drug use Exercise: moderately active  She does get adequate calcium and some Vitamin D in her diet. 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    Patient Active Problem List   Diagnosis Date Noted   Encounter for screening colonoscopy 03/12/2023   Polyp of sigmoid colon 03/12/2023   Menorrhagia with irregular cycle 01/11/2021   Intramural, submucous, and subserous leiomyoma of uterus 01/11/2021    Past Surgical History:  Procedure Laterality Date   CESAREAN SECTION  2004, 2008   [facility]   COLONOSCOPY WITH PROPOFOL  N/A 03/12/2023   Procedure: COLONOSCOPY WITH  BIOPSY;  Surgeon: Jinny Carmine, MD;  Location:  [facility];  Service: Endoscopy;  Laterality: N/A;   COLPOSCOPY  2009   DILATATION & CURETTAGE/HYSTEROSCOPY WITH MYOSURE N/A 01/11/2021   Procedure: DILATATION & CURETTAGE/HYSTEROSCOPY WITH SUBMUCOSAL MYOMECTOMY;  Surgeon: Leonce Garnette JONETTA, MD;  Location: [facility];  Service: Gynecology;  Laterality: N/A;   HYSTEROSCOPY WITH D & C  2014   [facility] ([facility])   INDUCED ABORTION     POLYPECTOMY  03/12/2023   Procedure: POLYPECTOMY;  Surgeon: Jinny Carmine, MD;  Location: [facility];  Service: Endoscopy;;   WISDOM TOOTH EXTRACTION      Family History  Problem Relation Age of Onset   Breast cancer Mother 83   COPD Father    Diabetes Father        type 2   Hypertension Father    Diabetes type I Sister        1/2 sister   Diabetes Brother        type 2    Social History   Socioeconomic History   Marital status: Married    Spouse name: Not on file   Number of children: 2   Years of education: 18   Highest education level: Not on file  Occupational History   Occupation: TEACHER    Comment: [employer]  Tobacco Use   Smoking status: Never   Smokeless tobacco: Never  Vaping Use   Vaping status: Never Used  Substance and Sexual Activity   Alcohol use: Yes    Comment: SOCIAL  Drug use: No   Sexual activity: Yes    Birth control/protection: Pill  Other Topics Concern   Not on file  Social History Narrative   Patient lives with husband and 2 daughters. 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   Social Drivers of Corporate investment banker Strain: Not on file  Food Insecurity: Not on file  Transportation Needs: Not on file  Physical Activity: Not on file  Stress: Not on file  Social Connections: Not on file  Intimate Partner Violence: Not on file     Current Outpatient Medications:    levonorgestrel -ethinyl estradiol (SRONYX) 0.1-20 MG-MCG tablet, TAKE 1 TABLET BY MOUTH DAILY. CONTINUOUS DOSING, Disp: 84 tablet,  Rfl: 0     ROS:  Review of Systems  Constitutional:  Negative for fatigue, fever and unexpected weight change.  Respiratory:  Negative for cough, shortness of breath and wheezing.   Cardiovascular:  Negative for chest pain, palpitations and leg swelling.  Gastrointestinal:  Negative for blood in stool, constipation, diarrhea, nausea and vomiting.  Endocrine: Negative for cold intolerance, heat intolerance and polyuria.  Genitourinary:  Negative for dyspareunia, dysuria, flank pain, frequency, genital sores, hematuria, menstrual problem, pelvic pain, urgency, vaginal bleeding, vaginal discharge and vaginal pain.  Musculoskeletal:  Negative for back pain, joint swelling and myalgias.  Skin:  Negative for rash.  Neurological:  Negative for dizziness, syncope, light-headedness, numbness and headaches.  Hematological:  Negative for adenopathy.  Psychiatric/Behavioral:  Negative for agitation, confusion, sleep disturbance and suicidal ideas. The patient is not nervous/anxious.    BREAST: No symptoms    Objective: There were no vitals taken for this visit.   Physical Exam Constitutional:      Appearance: She is well-developed.  Genitourinary:     Vulva normal.     Right Labia: No rash, tenderness or lesions.    Left Labia: No tenderness, lesions or rash.    Vaginal bleeding present.     No vaginal discharge, erythema or tenderness.      Right Adnexa: not tender and no mass present.    Left Adnexa: not tender and no mass present.    No cervical friability or polyp.     Uterus is not enlarged or tender.  Breasts:    Right: No mass, nipple discharge, skin change or tenderness.     Left: No mass, nipple discharge, skin change or tenderness.  Neck:     Thyroid: No thyromegaly.  Cardiovascular:     Rate and Rhythm: Normal rate and regular rhythm.     Heart sounds: Normal heart sounds. No murmur heard. Pulmonary:     Effort: Pulmonary effort is normal.     Breath sounds: Normal  breath sounds.  Abdominal:     Palpations: Abdomen is soft.     Tenderness: There is no abdominal tenderness. There is no guarding or rebound.  Musculoskeletal:        General: Normal range of motion.     Cervical back: Normal range of motion.  Lymphadenopathy:     Cervical: No cervical adenopathy.  Neurological:     General: No focal deficit present.     Mental Status: She is alert and oriented to person, place, and time.     Cranial Nerves: No cranial nerve deficit.  Skin:    General: Skin is warm and dry.  Psychiatric:        Mood and Affect: Mood normal.  Behavior: Behavior normal.        Thought Content: Thought content normal.        Judgment: Judgment normal.  Vitals reviewed.    Assessment/Plan:  Encounter for annual routine gynecological examination  Encounter for surveillance of contraceptive pills - Plan: levonorgestrel -ethinyl estradiol (SRONYX) 0.1-20 MG-MCG tablet; OCP RF.   Encounter for screening mammogram for malignant neoplasm of breast - Plan: MM 3D SCREENING MAMMOGRAM BILATERAL BREAST; pt to schedule mammo  Screening for colon cancer - Plan: Ambulatory referral to Gastroenterology; refer to GI for scr colonoscopy  Menorrhagia with regular cycle - Plan: CBC with Differential/Platelet, levonorgestrel -ethinyl estradiol (SRONYX) 0.1-20 MG-MCG tablet; sx improved with cont dosing OCPs. Check CBC given hx of sx. Not taking Fe supp  No orders of the defined types were placed in this encounter.           GYN counsel breast self exam, mammography screening, adequate intake of calcium and vitamin D, diet and exercise    F/U  No follow-ups on file.  Raileigh Sabater B. Haidy Kackley, PA-C 03/03/2024 4:49 PM

## 2024-03-04 ENCOUNTER — Encounter: Payer: Self-pay | Admitting: Obstetrics and Gynecology

## 2024-03-04 ENCOUNTER — Ambulatory Visit: Payer: Self-pay | Admitting: Obstetrics and Gynecology

## 2024-03-04 ENCOUNTER — Other Ambulatory Visit (HOSPITAL_COMMUNITY)
Admission: RE | Admit: 2024-03-04 | Discharge: 2024-03-04 | Disposition: A | Discharge status: Home / Self Care | Source: Ambulatory Visit | Attending: Obstetrics and Gynecology | Admitting: Obstetrics and Gynecology

## 2024-03-04 VITALS — BP 101/63 | HR 64 | Ht 67.0 in | Wt 169.0 lb

## 2024-03-04 DIAGNOSIS — N920 Excessive and frequent menstruation with regular cycle: Secondary | ICD-10-CM | POA: Diagnosis not present

## 2024-03-04 DIAGNOSIS — N939 Abnormal uterine and vaginal bleeding, unspecified: Secondary | ICD-10-CM | POA: Diagnosis not present

## 2024-03-04 DIAGNOSIS — Z01411 Encounter for gynecological examination (general) (routine) with abnormal findings: Secondary | ICD-10-CM | POA: Diagnosis not present

## 2024-03-04 DIAGNOSIS — Z3041 Encounter for surveillance of contraceptive pills: Secondary | ICD-10-CM

## 2024-03-04 DIAGNOSIS — Z1231 Encounter for screening mammogram for malignant neoplasm of breast: Secondary | ICD-10-CM

## 2024-03-04 DIAGNOSIS — Z0000 Encounter for general adult medical examination without abnormal findings: Secondary | ICD-10-CM

## 2024-03-04 DIAGNOSIS — D219 Benign neoplasm of connective and other soft tissue, unspecified: Secondary | ICD-10-CM

## 2024-03-04 DIAGNOSIS — Z1329 Encounter for screening for other suspected endocrine disorder: Secondary | ICD-10-CM

## 2024-03-04 DIAGNOSIS — R635 Abnormal weight gain: Secondary | ICD-10-CM

## 2024-03-04 DIAGNOSIS — Z01419 Encounter for gynecological examination (general) (routine) without abnormal findings: Secondary | ICD-10-CM

## 2024-03-04 DIAGNOSIS — Z1151 Encounter for screening for human papillomavirus (HPV): Secondary | ICD-10-CM | POA: Diagnosis present

## 2024-03-04 DIAGNOSIS — Z124 Encounter for screening for malignant neoplasm of cervix: Secondary | ICD-10-CM | POA: Insufficient documentation

## 2024-03-04 DIAGNOSIS — Z131 Encounter for screening for diabetes mellitus: Secondary | ICD-10-CM

## 2024-03-04 DIAGNOSIS — D259 Leiomyoma of uterus, unspecified: Secondary | ICD-10-CM

## 2024-03-04 NOTE — Patient Instructions (Signed)
 I value your feedback and you entrusting [place] with your care. If you get a [facility] patient survey, I would appreciate you taking the time to let us know about your experience today. Thank you!  [facility] ([place])--[phone]

## 2024-03-05 LAB — CBC WITH DIFFERENTIAL/PLATELET
Basophils Absolute: 0 x10E3/uL (ref 0.0–0.2)
Basos: 1 %
EOS (ABSOLUTE): 0.1 x10E3/uL (ref 0.0–0.4)
Eos: 1 %
Hematocrit: 37.8 % (ref 34.0–46.6)
Hemoglobin: 11.7 g/dL (ref 11.1–15.9)
Immature Grans (Abs): 0 x10E3/uL (ref 0.0–0.1)
Immature Granulocytes: 0 %
Lymphocytes Absolute: 1.5 x10E3/uL (ref 0.7–3.1)
Lymphs: 27 %
MCH: 25.4 pg — ABNORMAL LOW (ref 26.6–33.0)
MCHC: 31 g/dL — ABNORMAL LOW (ref 31.5–35.7)
MCV: 82 fL (ref 79–97)
Monocytes Absolute: 0.4 x10E3/uL (ref 0.1–0.9)
Monocytes: 7 %
Neutrophils Absolute: 3.5 x10E3/uL (ref 1.4–7.0)
Neutrophils: 64 %
Platelets: 283 x10E3/uL (ref 150–450)
RBC: 4.6 x10E6/uL (ref 3.77–5.28)
RDW: 14.9 % (ref 11.7–15.4)
WBC: 5.4 x10E3/uL (ref 3.4–10.8)

## 2024-03-05 LAB — COMPREHENSIVE METABOLIC PANEL WITH GFR
ALT: 14 IU/L (ref 0–32)
AST: 24 IU/L (ref 0–40)
Albumin: 4.4 g/dL (ref 3.8–4.9)
Alkaline Phosphatase: 46 IU/L (ref 44–121)
BUN/Creatinine Ratio: 18 (ref 9–23)
BUN: 14 mg/dL (ref 6–24)
Bilirubin Total: 0.3 mg/dL (ref 0.0–1.2)
CO2: 20 mmol/L (ref 20–29)
Calcium: 9.3 mg/dL (ref 8.7–10.2)
Chloride: 103 mmol/L (ref 96–106)
Creatinine, Ser: 0.76 mg/dL (ref 0.57–1.00)
Globulin, Total: 3 g/dL (ref 1.5–4.5)
Glucose: 85 mg/dL (ref 70–99)
Potassium: 4.8 mmol/L (ref 3.5–5.2)
Sodium: 136 mmol/L (ref 134–144)
Total Protein: 7.4 g/dL (ref 6.0–8.5)
eGFR: 95 mL/min/1.73 (ref >59)

## 2024-03-05 LAB — HEMOGLOBIN A1C
Est. average glucose Bld gHb Est-mCnc: 105 mg/dL
Hgb A1c MFr Bld: 5.3 % (ref 4.8–5.6)

## 2024-03-05 LAB — TSH+FREE T4
Free T4: 0.96 ng/dL (ref 0.82–1.77)
TSH: 1.04 u[IU]/mL (ref 0.450–4.500)

## 2024-03-06 ENCOUNTER — Ambulatory Visit: Payer: Self-pay | Admitting: Obstetrics and Gynecology

## 2024-03-06 LAB — CYTOLOGY - PAP
Adequacy: ABSENT
Comment: NEGATIVE
Diagnosis: NEGATIVE
High risk HPV: NEGATIVE

## 2024-03-12 ENCOUNTER — Other Ambulatory Visit: Payer: Self-pay | Admitting: Obstetrics and Gynecology

## 2024-03-12 DIAGNOSIS — Z3041 Encounter for surveillance of contraceptive pills: Secondary | ICD-10-CM

## 2024-03-12 DIAGNOSIS — N920 Excessive and frequent menstruation with regular cycle: Secondary | ICD-10-CM

## 2024-03-28 ENCOUNTER — Ambulatory Visit (INDEPENDENT_AMBULATORY_CARE_PROVIDER_SITE_OTHER)

## 2024-03-28 DIAGNOSIS — N939 Abnormal uterine and vaginal bleeding, unspecified: Secondary | ICD-10-CM

## 2024-03-28 DIAGNOSIS — D259 Leiomyoma of uterus, unspecified: Secondary | ICD-10-CM

## 2024-03-28 DIAGNOSIS — D219 Benign neoplasm of connective and other soft tissue, unspecified: Secondary | ICD-10-CM

## 2024-03-28 DIAGNOSIS — N920 Excessive and frequent menstruation with regular cycle: Secondary | ICD-10-CM

## 2024-04-15 ENCOUNTER — Ambulatory Visit
Admission: RE | Admit: 2024-04-15 | Discharge: 2024-04-15 | Disposition: A | Discharge status: Home / Self Care | Source: Ambulatory Visit | Attending: Obstetrics and Gynecology | Admitting: Obstetrics and Gynecology

## 2024-04-15 DIAGNOSIS — Z1231 Encounter for screening mammogram for malignant neoplasm of breast: Secondary | ICD-10-CM | POA: Insufficient documentation

## 2024-05-16 ENCOUNTER — Other Ambulatory Visit: Payer: Self-pay

## 2024-05-16 DIAGNOSIS — Z3041 Encounter for surveillance of contraceptive pills: Secondary | ICD-10-CM

## 2024-05-16 DIAGNOSIS — N920 Excessive and frequent menstruation with regular cycle: Secondary | ICD-10-CM

## 2024-05-16 MED ORDER — LEVONORGESTREL-ETHINYL ESTRAD 0.1-20 MG-MCG PO TABS: 1.0000 | ORAL_TABLET | Freq: Every day | ORAL | 3 refills | Status: AC
